# Patient Record
Sex: Female | Born: 1935 | Race: White | Hispanic: No | Marital: Married | Smoking: Never smoker
Health system: Southern US, Community
[De-identification: ages and names within clinical notes are randomized; demographics above are authoritative.]

## PROBLEM LIST (undated history)

## (undated) DIAGNOSIS — I10 Essential (primary) hypertension: Secondary | ICD-10-CM

## (undated) DIAGNOSIS — F0390 Unspecified dementia without behavioral disturbance: Secondary | ICD-10-CM

---

## 1999-02-03 ENCOUNTER — Other Ambulatory Visit: Admission: RE | Admit: 1999-02-03 | Discharge: 1999-02-03 | Payer: Self-pay | Admitting: Obstetrics and Gynecology

## 2005-02-01 ENCOUNTER — Encounter: Admission: RE | Admit: 2005-02-01 | Discharge: 2005-02-01 | Payer: Self-pay | Admitting: Orthopedic Surgery

## 2005-02-03 ENCOUNTER — Ambulatory Visit (HOSPITAL_COMMUNITY): Admission: RE | Admit: 2005-02-03 | Discharge: 2005-02-03 | Payer: Self-pay | Admitting: Orthopedic Surgery

## 2005-02-03 ENCOUNTER — Ambulatory Visit (HOSPITAL_BASED_OUTPATIENT_CLINIC_OR_DEPARTMENT_OTHER): Admission: RE | Admit: 2005-02-03 | Discharge: 2005-02-03 | Payer: Self-pay | Admitting: Orthopedic Surgery

## 2005-02-20 ENCOUNTER — Encounter: Admission: RE | Admit: 2005-02-20 | Discharge: 2005-02-20 | Payer: Self-pay | Admitting: Family Medicine

## 2007-04-13 IMAGING — CR DG CHEST 2V
2 series · 2 of 2 positions shown · non-contrast
Comparison: None.

CLINICAL DATA: Pre-op chest radiograph for olecranon fracture. 
 2-VIEW CHEST:

[w chest pa]
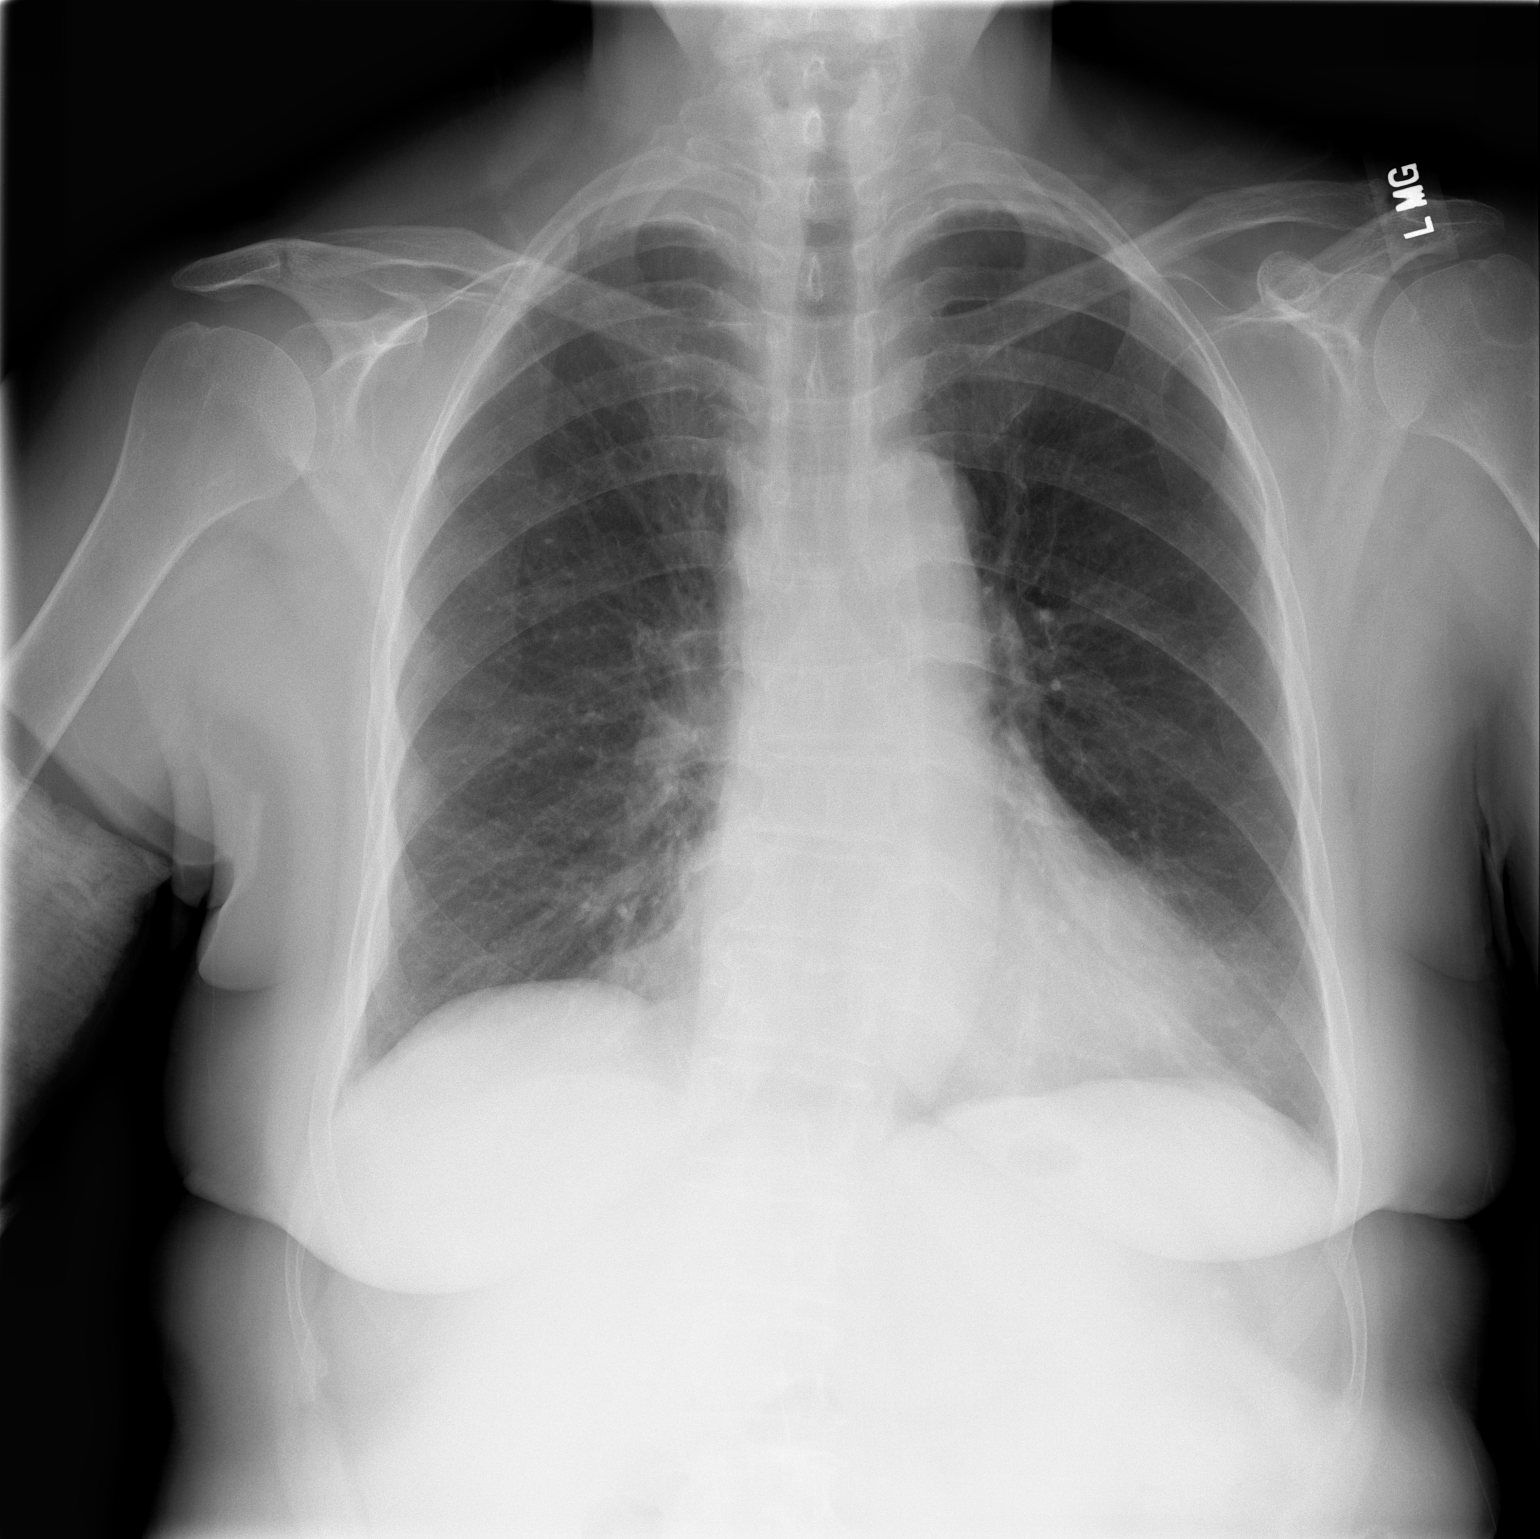

[w chest lat]
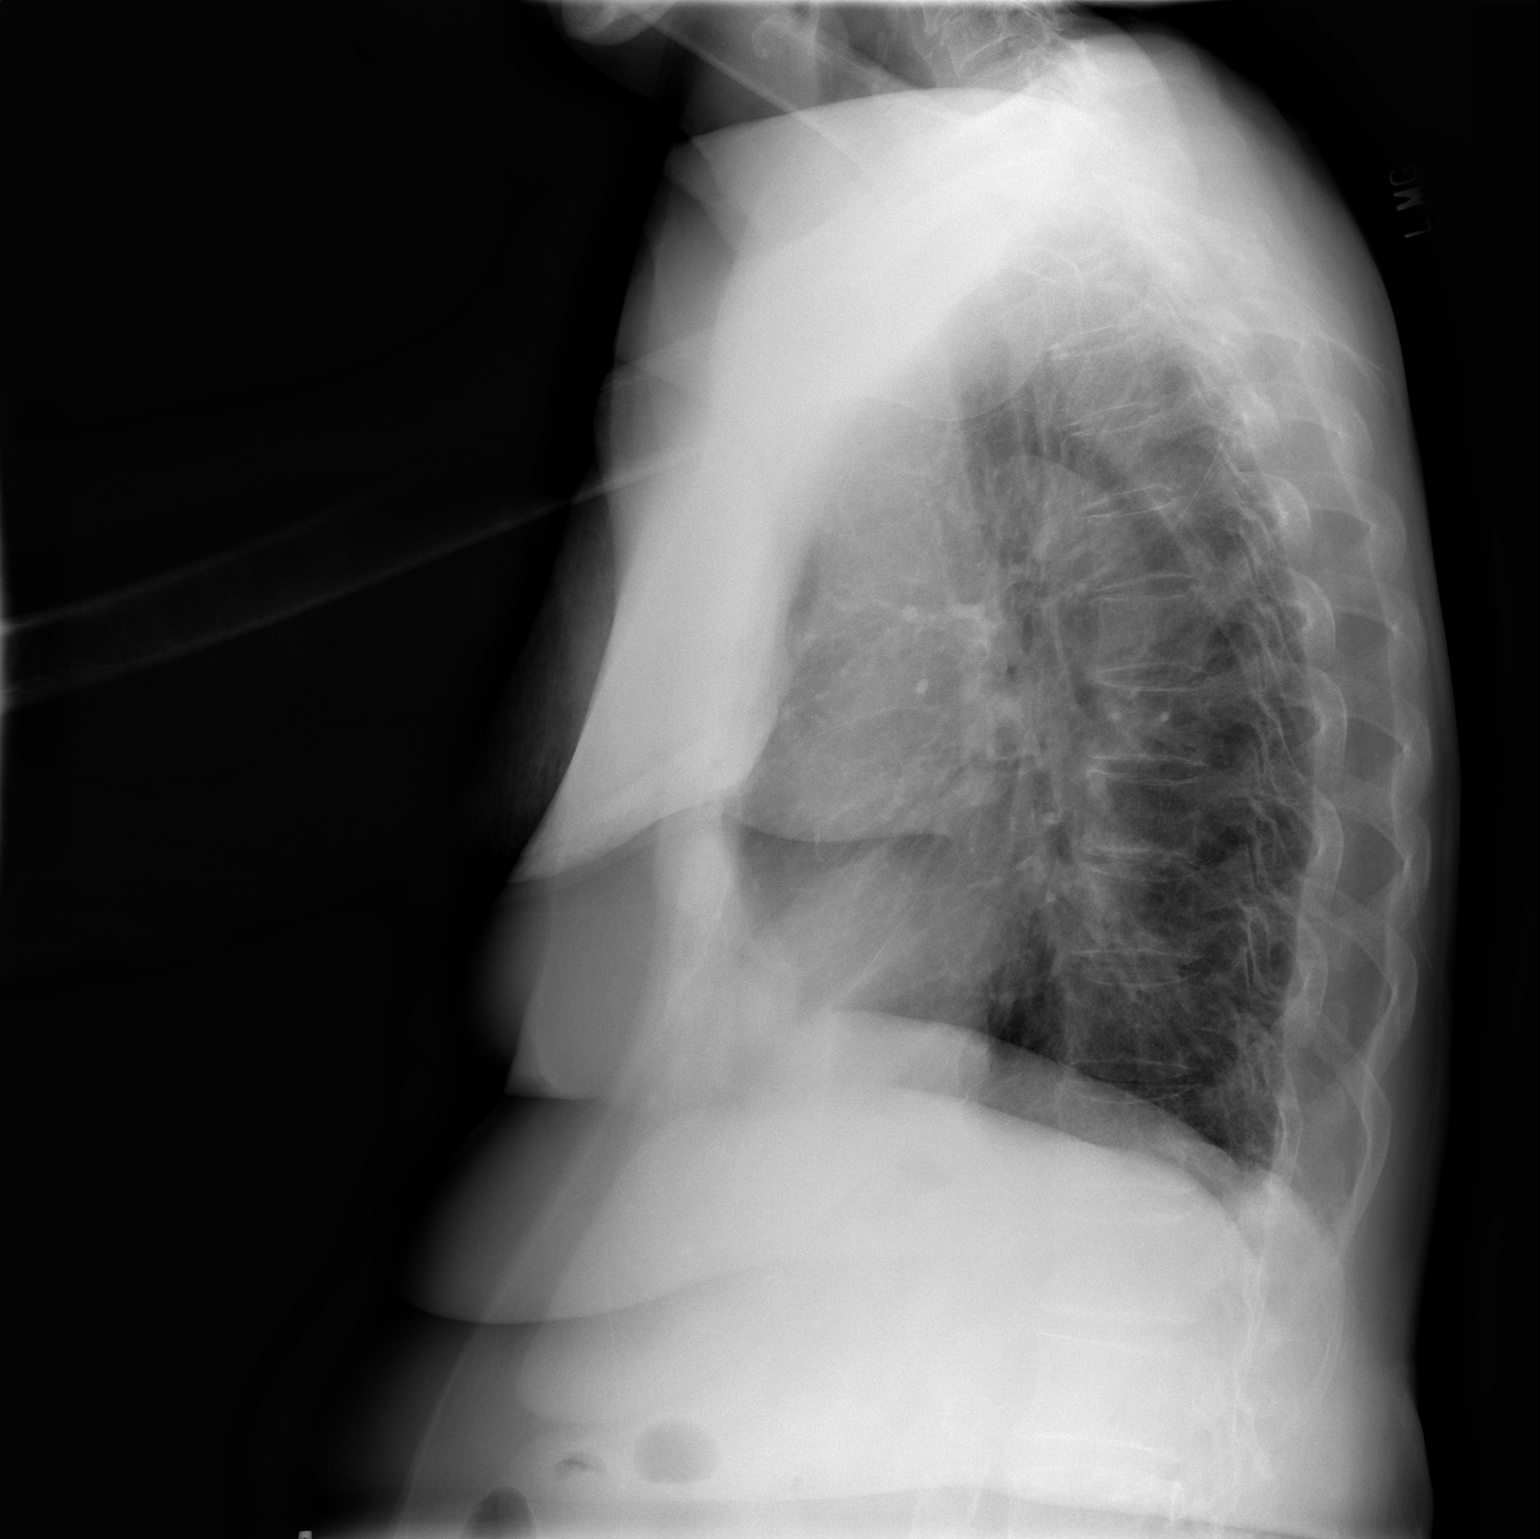

[2 of 2 positions shown; findings below may reference images not displayed]

FINDINGS: The heart size is within normal limits.   No effusions or edema.  There are no focal airspace opacities identified.  Within the right upper lobe there is a 5.9 mm nodule.  If outside films are available for comparison then I would recommend comparison with these.  Alternatively a CT scan of the chest would be recommended for a more definitive evaluation.
IMPRESSION: 6 mm nodule right upper lobe.  Recommend comparison with outside films.  If none are available then a CT of the chest is recommended.

## 2011-04-03 DIAGNOSIS — H353190 Nonexudative age-related macular degeneration, unspecified eye, stage unspecified: Secondary | ICD-10-CM | POA: Diagnosis not present

## 2011-05-12 DIAGNOSIS — F0390 Unspecified dementia without behavioral disturbance: Secondary | ICD-10-CM | POA: Diagnosis not present

## 2011-05-12 DIAGNOSIS — R7309 Other abnormal glucose: Secondary | ICD-10-CM | POA: Diagnosis not present

## 2011-05-12 DIAGNOSIS — I10 Essential (primary) hypertension: Secondary | ICD-10-CM | POA: Diagnosis not present

## 2011-05-12 DIAGNOSIS — E782 Mixed hyperlipidemia: Secondary | ICD-10-CM | POA: Diagnosis not present

## 2011-10-04 DIAGNOSIS — H353190 Nonexudative age-related macular degeneration, unspecified eye, stage unspecified: Secondary | ICD-10-CM | POA: Diagnosis not present

## 2011-11-13 DIAGNOSIS — E782 Mixed hyperlipidemia: Secondary | ICD-10-CM | POA: Diagnosis not present

## 2011-11-13 DIAGNOSIS — I10 Essential (primary) hypertension: Secondary | ICD-10-CM | POA: Diagnosis not present

## 2011-11-13 DIAGNOSIS — R7309 Other abnormal glucose: Secondary | ICD-10-CM | POA: Diagnosis not present

## 2011-11-13 DIAGNOSIS — F0390 Unspecified dementia without behavioral disturbance: Secondary | ICD-10-CM | POA: Diagnosis not present

## 2011-11-13 DIAGNOSIS — Z23 Encounter for immunization: Secondary | ICD-10-CM | POA: Diagnosis not present

## 2012-05-15 DIAGNOSIS — Z0000 Encounter for general adult medical examination without abnormal findings: Secondary | ICD-10-CM | POA: Diagnosis not present

## 2012-05-15 DIAGNOSIS — R7309 Other abnormal glucose: Secondary | ICD-10-CM | POA: Diagnosis not present

## 2012-05-15 DIAGNOSIS — E782 Mixed hyperlipidemia: Secondary | ICD-10-CM | POA: Diagnosis not present

## 2012-05-15 DIAGNOSIS — I10 Essential (primary) hypertension: Secondary | ICD-10-CM | POA: Diagnosis not present

## 2012-05-15 DIAGNOSIS — F0390 Unspecified dementia without behavioral disturbance: Secondary | ICD-10-CM | POA: Diagnosis not present

## 2012-06-10 DIAGNOSIS — H353190 Nonexudative age-related macular degeneration, unspecified eye, stage unspecified: Secondary | ICD-10-CM | POA: Diagnosis not present

## 2012-06-18 DIAGNOSIS — Z780 Asymptomatic menopausal state: Secondary | ICD-10-CM | POA: Diagnosis not present

## 2012-07-19 DIAGNOSIS — M810 Age-related osteoporosis without current pathological fracture: Secondary | ICD-10-CM | POA: Diagnosis not present

## 2012-12-04 DIAGNOSIS — E40 Kwashiorkor: Secondary | ICD-10-CM | POA: Diagnosis not present

## 2012-12-04 DIAGNOSIS — F0390 Unspecified dementia without behavioral disturbance: Secondary | ICD-10-CM | POA: Diagnosis not present

## 2012-12-04 DIAGNOSIS — Z23 Encounter for immunization: Secondary | ICD-10-CM | POA: Diagnosis not present

## 2012-12-04 DIAGNOSIS — E782 Mixed hyperlipidemia: Secondary | ICD-10-CM | POA: Diagnosis not present

## 2012-12-04 DIAGNOSIS — R7309 Other abnormal glucose: Secondary | ICD-10-CM | POA: Diagnosis not present

## 2012-12-04 DIAGNOSIS — I10 Essential (primary) hypertension: Secondary | ICD-10-CM | POA: Diagnosis not present

## 2012-12-24 DIAGNOSIS — H353190 Nonexudative age-related macular degeneration, unspecified eye, stage unspecified: Secondary | ICD-10-CM | POA: Diagnosis not present

## 2012-12-24 DIAGNOSIS — H02829 Cysts of unspecified eye, unspecified eyelid: Secondary | ICD-10-CM | POA: Diagnosis not present

## 2013-01-20 DIAGNOSIS — M810 Age-related osteoporosis without current pathological fracture: Secondary | ICD-10-CM | POA: Diagnosis not present

## 2013-08-14 DIAGNOSIS — E40 Kwashiorkor: Secondary | ICD-10-CM | POA: Diagnosis not present

## 2013-08-14 DIAGNOSIS — M810 Age-related osteoporosis without current pathological fracture: Secondary | ICD-10-CM | POA: Diagnosis not present

## 2013-08-14 DIAGNOSIS — Z0000 Encounter for general adult medical examination without abnormal findings: Secondary | ICD-10-CM | POA: Diagnosis not present

## 2013-08-14 DIAGNOSIS — R7309 Other abnormal glucose: Secondary | ICD-10-CM | POA: Diagnosis not present

## 2013-08-14 DIAGNOSIS — E782 Mixed hyperlipidemia: Secondary | ICD-10-CM | POA: Diagnosis not present

## 2013-08-14 DIAGNOSIS — Z23 Encounter for immunization: Secondary | ICD-10-CM | POA: Diagnosis not present

## 2013-08-14 DIAGNOSIS — I10 Essential (primary) hypertension: Secondary | ICD-10-CM | POA: Diagnosis not present

## 2013-08-14 DIAGNOSIS — Z1331 Encounter for screening for depression: Secondary | ICD-10-CM | POA: Diagnosis not present

## 2013-08-14 DIAGNOSIS — F0390 Unspecified dementia without behavioral disturbance: Secondary | ICD-10-CM | POA: Diagnosis not present

## 2014-01-15 DIAGNOSIS — Z23 Encounter for immunization: Secondary | ICD-10-CM | POA: Diagnosis not present

## 2014-02-20 DIAGNOSIS — E782 Mixed hyperlipidemia: Secondary | ICD-10-CM | POA: Diagnosis not present

## 2014-02-20 DIAGNOSIS — I10 Essential (primary) hypertension: Secondary | ICD-10-CM | POA: Diagnosis not present

## 2014-02-20 DIAGNOSIS — M810 Age-related osteoporosis without current pathological fracture: Secondary | ICD-10-CM | POA: Diagnosis not present

## 2014-02-20 DIAGNOSIS — R7309 Other abnormal glucose: Secondary | ICD-10-CM | POA: Diagnosis not present

## 2014-02-20 DIAGNOSIS — F0390 Unspecified dementia without behavioral disturbance: Secondary | ICD-10-CM | POA: Diagnosis not present

## 2014-02-20 DIAGNOSIS — E46 Unspecified protein-calorie malnutrition: Secondary | ICD-10-CM | POA: Diagnosis not present

## 2014-08-21 DIAGNOSIS — R7309 Other abnormal glucose: Secondary | ICD-10-CM | POA: Diagnosis not present

## 2014-08-21 DIAGNOSIS — E46 Unspecified protein-calorie malnutrition: Secondary | ICD-10-CM | POA: Diagnosis not present

## 2014-08-21 DIAGNOSIS — F0390 Unspecified dementia without behavioral disturbance: Secondary | ICD-10-CM | POA: Diagnosis not present

## 2014-08-21 DIAGNOSIS — M810 Age-related osteoporosis without current pathological fracture: Secondary | ICD-10-CM | POA: Diagnosis not present

## 2014-08-21 DIAGNOSIS — E782 Mixed hyperlipidemia: Secondary | ICD-10-CM | POA: Diagnosis not present

## 2014-08-21 DIAGNOSIS — D692 Other nonthrombocytopenic purpura: Secondary | ICD-10-CM | POA: Diagnosis not present

## 2014-08-21 DIAGNOSIS — Z0000 Encounter for general adult medical examination without abnormal findings: Secondary | ICD-10-CM | POA: Diagnosis not present

## 2014-08-21 DIAGNOSIS — I10 Essential (primary) hypertension: Secondary | ICD-10-CM | POA: Diagnosis not present

## 2014-12-05 DIAGNOSIS — Z23 Encounter for immunization: Secondary | ICD-10-CM | POA: Diagnosis not present

## 2015-02-24 DIAGNOSIS — E46 Unspecified protein-calorie malnutrition: Secondary | ICD-10-CM | POA: Diagnosis not present

## 2015-02-24 DIAGNOSIS — I10 Essential (primary) hypertension: Secondary | ICD-10-CM | POA: Diagnosis not present

## 2015-02-24 DIAGNOSIS — E782 Mixed hyperlipidemia: Secondary | ICD-10-CM | POA: Diagnosis not present

## 2015-02-24 DIAGNOSIS — F0390 Unspecified dementia without behavioral disturbance: Secondary | ICD-10-CM | POA: Diagnosis not present

## 2015-02-24 DIAGNOSIS — R7309 Other abnormal glucose: Secondary | ICD-10-CM | POA: Diagnosis not present

## 2015-11-03 DIAGNOSIS — D692 Other nonthrombocytopenic purpura: Secondary | ICD-10-CM | POA: Diagnosis not present

## 2015-11-03 DIAGNOSIS — E46 Unspecified protein-calorie malnutrition: Secondary | ICD-10-CM | POA: Diagnosis not present

## 2015-11-03 DIAGNOSIS — I10 Essential (primary) hypertension: Secondary | ICD-10-CM | POA: Diagnosis not present

## 2015-11-03 DIAGNOSIS — R7303 Prediabetes: Secondary | ICD-10-CM | POA: Diagnosis not present

## 2015-11-03 DIAGNOSIS — Z0000 Encounter for general adult medical examination without abnormal findings: Secondary | ICD-10-CM | POA: Diagnosis not present

## 2015-11-03 DIAGNOSIS — M810 Age-related osteoporosis without current pathological fracture: Secondary | ICD-10-CM | POA: Diagnosis not present

## 2015-11-03 DIAGNOSIS — E782 Mixed hyperlipidemia: Secondary | ICD-10-CM | POA: Diagnosis not present

## 2015-11-03 DIAGNOSIS — Z23 Encounter for immunization: Secondary | ICD-10-CM | POA: Diagnosis not present

## 2015-11-03 DIAGNOSIS — F0390 Unspecified dementia without behavioral disturbance: Secondary | ICD-10-CM | POA: Diagnosis not present

## 2015-12-01 DIAGNOSIS — M810 Age-related osteoporosis without current pathological fracture: Secondary | ICD-10-CM | POA: Diagnosis not present

## 2015-12-14 DIAGNOSIS — M810 Age-related osteoporosis without current pathological fracture: Secondary | ICD-10-CM | POA: Diagnosis not present

## 2016-05-31 DIAGNOSIS — E782 Mixed hyperlipidemia: Secondary | ICD-10-CM | POA: Diagnosis not present

## 2016-05-31 DIAGNOSIS — F0390 Unspecified dementia without behavioral disturbance: Secondary | ICD-10-CM | POA: Diagnosis not present

## 2016-05-31 DIAGNOSIS — D692 Other nonthrombocytopenic purpura: Secondary | ICD-10-CM | POA: Diagnosis not present

## 2016-05-31 DIAGNOSIS — E46 Unspecified protein-calorie malnutrition: Secondary | ICD-10-CM | POA: Diagnosis not present

## 2016-05-31 DIAGNOSIS — R7303 Prediabetes: Secondary | ICD-10-CM | POA: Diagnosis not present

## 2016-05-31 DIAGNOSIS — I10 Essential (primary) hypertension: Secondary | ICD-10-CM | POA: Diagnosis not present

## 2016-06-13 DIAGNOSIS — M810 Age-related osteoporosis without current pathological fracture: Secondary | ICD-10-CM | POA: Diagnosis not present

## 2016-11-22 DIAGNOSIS — E46 Unspecified protein-calorie malnutrition: Secondary | ICD-10-CM | POA: Diagnosis not present

## 2016-11-22 DIAGNOSIS — I10 Essential (primary) hypertension: Secondary | ICD-10-CM | POA: Diagnosis not present

## 2016-11-22 DIAGNOSIS — M810 Age-related osteoporosis without current pathological fracture: Secondary | ICD-10-CM | POA: Diagnosis not present

## 2016-11-22 DIAGNOSIS — D692 Other nonthrombocytopenic purpura: Secondary | ICD-10-CM | POA: Diagnosis not present

## 2016-11-22 DIAGNOSIS — R7303 Prediabetes: Secondary | ICD-10-CM | POA: Diagnosis not present

## 2016-11-22 DIAGNOSIS — F0390 Unspecified dementia without behavioral disturbance: Secondary | ICD-10-CM | POA: Diagnosis not present

## 2016-11-22 DIAGNOSIS — Z0000 Encounter for general adult medical examination without abnormal findings: Secondary | ICD-10-CM | POA: Diagnosis not present

## 2016-11-22 DIAGNOSIS — Z23 Encounter for immunization: Secondary | ICD-10-CM | POA: Diagnosis not present

## 2016-11-22 DIAGNOSIS — E782 Mixed hyperlipidemia: Secondary | ICD-10-CM | POA: Diagnosis not present

## 2016-12-15 DIAGNOSIS — M810 Age-related osteoporosis without current pathological fracture: Secondary | ICD-10-CM | POA: Diagnosis not present

## 2017-06-19 DIAGNOSIS — M810 Age-related osteoporosis without current pathological fracture: Secondary | ICD-10-CM | POA: Diagnosis not present

## 2017-06-19 DIAGNOSIS — M25552 Pain in left hip: Secondary | ICD-10-CM | POA: Diagnosis not present

## 2017-06-25 ENCOUNTER — Encounter (INDEPENDENT_AMBULATORY_CARE_PROVIDER_SITE_OTHER): Payer: Self-pay | Admitting: Orthopaedic Surgery

## 2017-06-25 ENCOUNTER — Ambulatory Visit (INDEPENDENT_AMBULATORY_CARE_PROVIDER_SITE_OTHER): Payer: Medicare Other | Admitting: Orthopaedic Surgery

## 2017-06-25 ENCOUNTER — Ambulatory Visit (INDEPENDENT_AMBULATORY_CARE_PROVIDER_SITE_OTHER): Payer: Medicare Other

## 2017-06-25 DIAGNOSIS — M25552 Pain in left hip: Secondary | ICD-10-CM

## 2017-06-25 NOTE — Progress Notes (Signed)
   Office Visit Note   Patient: Patricia Carlson           Date of Birth: 06/08/1925           MRN: [id number] Visit Date: 06/25/2017              Requested by: Maurice Small, MD [address] [place], [place] [address] PCP: Arabella Merles, CNM   Assessment & Plan: Visit Diagnoses:  1. Pain in left hip     Plan: I do feel that this is a component of trochanteric bursitis.  However pain is minimal and I did offer a steroid injection but she declined this saying that she is not having really much pain at all.  I talked her about trying a cane in her opposite hand if she is any fall risk at all.  All question concerns were answered and addressed.  Follow-up will be as needed.  I did show her stretching exercises to try.  Follow-Up Instructions: Return if symptoms worsen or fail to improve.   Orders:  Orders Placed This Encounter  Procedures  . XR HIP UNILAT W OR W/O PELVIS 1V LEFT   No orders of the defined types were placed in this encounter.     Procedures: No procedures performed   Clinical Data: No additional findings.   Subjective: Chief Complaint  Patient presents with  . Left Hip - Pain  The patient is a very pleasant 84 year old female who comes in originally for left hip pain it was causing her to have difficulty walking.  It was a lateral aspect of her hip was a source of her pain.  She denies any injuries.  She is not walking there with any type of cane and is pain-free per her report today and has no issues.  She denies any recent falls.  She does have a cane at home but only uses it if she is having issues.  Her daughter is with her today and says that a couple weeks ago she was hurting significantly.  HPI  Review of Systems She currently denies any headache, chest pain, shortness of breath, fever, chills, nausea, vomiting. Objective: Vital Signs: There were no vitals taken for this visit.  Physical Exam She is alert and oriented x3 in  no acute distress Ortho Exam Examination of her left hip shows fluid internal and external rotation with no pain at all.  She only has minimal pain to palpation of the trochanteric area only. Specialty Comments:  No specialty comments available.  Imaging: Xr Hip Unilat W Or W/o Pelvis 1v Left  Result Date: 06/25/2017 An AP pelvis and lateral left hip shows no acute findings.  There is no evidence of fracture.  The hip joint itself is well-maintained in terms of the joint space.    PMFS History: There are no active problems to display for this patient.  History reviewed. No pertinent past medical history.  History reviewed. No pertinent family history.  History reviewed. No pertinent surgical history. Social History   Occupational History  . Not on file  Tobacco Use  . Smoking status: Not on file  Substance and Sexual Activity  . Alcohol use: Not on file  . Drug use: Not on file  . Sexual activity: Not on file

## 2017-08-22 DIAGNOSIS — H6123 Impacted cerumen, bilateral: Secondary | ICD-10-CM | POA: Diagnosis not present

## 2017-12-26 DIAGNOSIS — Z0000 Encounter for general adult medical examination without abnormal findings: Secondary | ICD-10-CM | POA: Diagnosis not present

## 2017-12-26 DIAGNOSIS — D692 Other nonthrombocytopenic purpura: Secondary | ICD-10-CM | POA: Diagnosis not present

## 2017-12-26 DIAGNOSIS — R159 Full incontinence of feces: Secondary | ICD-10-CM | POA: Diagnosis not present

## 2017-12-26 DIAGNOSIS — E782 Mixed hyperlipidemia: Secondary | ICD-10-CM | POA: Diagnosis not present

## 2017-12-26 DIAGNOSIS — F0390 Unspecified dementia without behavioral disturbance: Secondary | ICD-10-CM | POA: Diagnosis not present

## 2017-12-26 DIAGNOSIS — Z23 Encounter for immunization: Secondary | ICD-10-CM | POA: Diagnosis not present

## 2017-12-26 DIAGNOSIS — M810 Age-related osteoporosis without current pathological fracture: Secondary | ICD-10-CM | POA: Diagnosis not present

## 2017-12-26 DIAGNOSIS — E46 Unspecified protein-calorie malnutrition: Secondary | ICD-10-CM | POA: Diagnosis not present

## 2017-12-26 DIAGNOSIS — I10 Essential (primary) hypertension: Secondary | ICD-10-CM | POA: Diagnosis not present

## 2017-12-26 DIAGNOSIS — R7303 Prediabetes: Secondary | ICD-10-CM | POA: Diagnosis not present

## 2018-06-27 DIAGNOSIS — F0390 Unspecified dementia without behavioral disturbance: Secondary | ICD-10-CM | POA: Diagnosis not present

## 2018-06-27 DIAGNOSIS — M810 Age-related osteoporosis without current pathological fracture: Secondary | ICD-10-CM | POA: Diagnosis not present

## 2018-06-27 DIAGNOSIS — E782 Mixed hyperlipidemia: Secondary | ICD-10-CM | POA: Diagnosis not present

## 2018-06-27 DIAGNOSIS — R7303 Prediabetes: Secondary | ICD-10-CM | POA: Diagnosis not present

## 2018-06-27 DIAGNOSIS — I10 Essential (primary) hypertension: Secondary | ICD-10-CM | POA: Diagnosis not present

## 2018-08-28 DIAGNOSIS — M810 Age-related osteoporosis without current pathological fracture: Secondary | ICD-10-CM | POA: Diagnosis not present

## 2018-10-08 DIAGNOSIS — H6123 Impacted cerumen, bilateral: Secondary | ICD-10-CM | POA: Diagnosis not present

## 2019-01-01 DIAGNOSIS — Z0000 Encounter for general adult medical examination without abnormal findings: Secondary | ICD-10-CM | POA: Diagnosis not present

## 2019-01-01 DIAGNOSIS — R7303 Prediabetes: Secondary | ICD-10-CM | POA: Diagnosis not present

## 2019-01-01 DIAGNOSIS — M810 Age-related osteoporosis without current pathological fracture: Secondary | ICD-10-CM | POA: Diagnosis not present

## 2019-01-01 DIAGNOSIS — F0390 Unspecified dementia without behavioral disturbance: Secondary | ICD-10-CM | POA: Diagnosis not present

## 2019-01-01 DIAGNOSIS — D692 Other nonthrombocytopenic purpura: Secondary | ICD-10-CM | POA: Diagnosis not present

## 2019-01-01 DIAGNOSIS — Z23 Encounter for immunization: Secondary | ICD-10-CM | POA: Diagnosis not present

## 2019-01-01 DIAGNOSIS — I10 Essential (primary) hypertension: Secondary | ICD-10-CM | POA: Diagnosis not present

## 2019-01-01 DIAGNOSIS — E46 Unspecified protein-calorie malnutrition: Secondary | ICD-10-CM | POA: Diagnosis not present

## 2019-01-01 DIAGNOSIS — E782 Mixed hyperlipidemia: Secondary | ICD-10-CM | POA: Diagnosis not present

## 2019-01-01 DIAGNOSIS — R159 Full incontinence of feces: Secondary | ICD-10-CM | POA: Diagnosis not present

## 2019-02-15 ENCOUNTER — Encounter (HOSPITAL_COMMUNITY): Payer: Self-pay | Admitting: Emergency Medicine

## 2019-02-15 ENCOUNTER — Inpatient Hospital Stay (HOSPITAL_COMMUNITY)
Admission: EM | Admit: 2019-02-15 | Discharge: 2019-02-22 | DRG: 640 | Disposition: A | Discharge status: Skilled Nursing Facility | Payer: Medicare Other | Attending: Internal Medicine | Admitting: Internal Medicine

## 2019-02-15 ENCOUNTER — Other Ambulatory Visit: Payer: Self-pay

## 2019-02-15 ENCOUNTER — Observation Stay (HOSPITAL_COMMUNITY): Payer: Medicare Other

## 2019-02-15 ENCOUNTER — Emergency Department (HOSPITAL_COMMUNITY): Payer: Medicare Other

## 2019-02-15 DIAGNOSIS — R197 Diarrhea, unspecified: Secondary | ICD-10-CM

## 2019-02-15 DIAGNOSIS — Z66 Do not resuscitate: Secondary | ICD-10-CM | POA: Diagnosis not present

## 2019-02-15 DIAGNOSIS — E43 Unspecified severe protein-calorie malnutrition: Secondary | ICD-10-CM | POA: Diagnosis not present

## 2019-02-15 DIAGNOSIS — Z20822 Contact with and (suspected) exposure to covid-19: Secondary | ICD-10-CM | POA: Diagnosis not present

## 2019-02-15 DIAGNOSIS — Z79899 Other long term (current) drug therapy: Secondary | ICD-10-CM

## 2019-02-15 DIAGNOSIS — R140 Abdominal distension (gaseous): Secondary | ICD-10-CM | POA: Diagnosis present

## 2019-02-15 DIAGNOSIS — E785 Hyperlipidemia, unspecified: Secondary | ICD-10-CM | POA: Diagnosis present

## 2019-02-15 DIAGNOSIS — R52 Pain, unspecified: Secondary | ICD-10-CM | POA: Diagnosis not present

## 2019-02-15 DIAGNOSIS — R4182 Altered mental status, unspecified: Secondary | ICD-10-CM | POA: Diagnosis not present

## 2019-02-15 DIAGNOSIS — I10 Essential (primary) hypertension: Secondary | ICD-10-CM | POA: Diagnosis present

## 2019-02-15 DIAGNOSIS — F0390 Unspecified dementia without behavioral disturbance: Secondary | ICD-10-CM | POA: Diagnosis not present

## 2019-02-15 DIAGNOSIS — E876 Hypokalemia: Secondary | ICD-10-CM | POA: Diagnosis not present

## 2019-02-15 DIAGNOSIS — Z03818 Encounter for observation for suspected exposure to other biological agents ruled out: Secondary | ICD-10-CM | POA: Diagnosis not present

## 2019-02-15 DIAGNOSIS — E860 Dehydration: Secondary | ICD-10-CM

## 2019-02-15 DIAGNOSIS — H9190 Unspecified hearing loss, unspecified ear: Secondary | ICD-10-CM | POA: Diagnosis present

## 2019-02-15 DIAGNOSIS — Z681 Body mass index (BMI) 19 or less, adult: Secondary | ICD-10-CM

## 2019-02-15 DIAGNOSIS — Z803 Family history of malignant neoplasm of breast: Secondary | ICD-10-CM

## 2019-02-15 DIAGNOSIS — R5381 Other malaise: Secondary | ICD-10-CM | POA: Diagnosis not present

## 2019-02-15 DIAGNOSIS — Z515 Encounter for palliative care: Secondary | ICD-10-CM

## 2019-02-15 DIAGNOSIS — R64 Cachexia: Secondary | ICD-10-CM | POA: Diagnosis not present

## 2019-02-15 DIAGNOSIS — R0902 Hypoxemia: Secondary | ICD-10-CM | POA: Diagnosis not present

## 2019-02-15 DIAGNOSIS — R531 Weakness: Secondary | ICD-10-CM | POA: Diagnosis not present

## 2019-02-15 DIAGNOSIS — R627 Adult failure to thrive: Secondary | ICD-10-CM | POA: Diagnosis present

## 2019-02-15 HISTORY — DX: Essential (primary) hypertension: I10

## 2019-02-15 HISTORY — DX: Unspecified dementia, unspecified severity, without behavioral disturbance, psychotic disturbance, mood disturbance, and anxiety: F03.90

## 2019-02-15 LAB — COMPREHENSIVE METABOLIC PANEL
ALT: 10 U/L (ref 0–44)
AST: 19 U/L (ref 15–41)
Albumin: 2.9 g/dL — ABNORMAL LOW (ref 3.5–5.0)
Alkaline Phosphatase: 56 U/L (ref 38–126)
Anion gap: 13 (ref 5–15)
BUN: 19 mg/dL (ref 8–23)
CO2: 31 mmol/L (ref 22–32)
Calcium: 7.6 mg/dL — ABNORMAL LOW (ref 8.9–10.3)
Chloride: 98 mmol/L (ref 98–111)
Creatinine, Ser: 0.69 mg/dL (ref 0.44–1.00)
GFR calc Af Amer: >60 mL/min (ref >60)
GFR calc non Af Amer: >60 mL/min (ref >60)
Glucose, Bld: 105 mg/dL — ABNORMAL HIGH (ref 70–99)
Potassium: 2.8 mmol/L — ABNORMAL LOW (ref 3.5–5.1)
Sodium: 142 mmol/L (ref 135–145)
Total Bilirubin: 1.5 mg/dL — ABNORMAL HIGH (ref 0.3–1.2)
Total Protein: 5.5 g/dL — ABNORMAL LOW (ref 6.5–8.1)

## 2019-02-15 LAB — RESPIRATORY PANEL BY RT PCR (FLU A&B, COVID)
Influenza A by PCR: NEGATIVE
Influenza B by PCR: NEGATIVE
SARS Coronavirus 2 by RT PCR: NEGATIVE

## 2019-02-15 LAB — MAGNESIUM: Magnesium: 2.3 mg/dL (ref 1.7–2.4)

## 2019-02-15 LAB — CBC WITH DIFFERENTIAL/PLATELET
Abs Immature Granulocytes: 0.03 10*3/uL (ref 0.00–0.07)
Basophils Absolute: 0 10*3/uL (ref 0.0–0.1)
Basophils Relative: 1 %
Eosinophils Absolute: 0 10*3/uL (ref 0.0–0.5)
Eosinophils Relative: 0 %
HCT: 44.5 % (ref 36.0–46.0)
Hemoglobin: 14.2 g/dL (ref 12.0–15.0)
Immature Granulocytes: 0 %
Lymphocytes Relative: 13 %
Lymphs Abs: 0.9 10*3/uL (ref 0.7–4.0)
MCH: 32.8 pg (ref 26.0–34.0)
MCHC: 31.9 g/dL (ref 30.0–36.0)
MCV: 102.8 fL — ABNORMAL HIGH (ref 80.0–100.0)
Monocytes Absolute: 0.4 10*3/uL (ref 0.1–1.0)
Monocytes Relative: 6 %
Neutro Abs: 5.9 10*3/uL (ref 1.7–7.7)
Neutrophils Relative %: 80 %
Platelets: 155 10*3/uL (ref 150–400)
RBC: 4.33 MIL/uL (ref 3.87–5.11)
RDW: 14.9 % (ref 11.5–15.5)
WBC: 7.3 10*3/uL (ref 4.0–10.5)
nRBC: 0 % (ref 0.0–0.2)

## 2019-02-15 LAB — CK: Total CK: 40 U/L (ref 38–234)

## 2019-02-15 LAB — TSH: TSH: 3.142 u[IU]/mL (ref 0.350–4.500)

## 2019-02-15 LAB — LACTIC ACID, PLASMA: Lactic Acid, Venous: 1.8 mmol/L (ref 0.5–1.9)

## 2019-02-15 LAB — PHOSPHORUS: Phosphorus: 2.5 mg/dL (ref 2.5–4.6)

## 2019-02-15 MED ORDER — SODIUM CHLORIDE 0.9 % IV SOLN: INTRAVENOUS | Status: DC

## 2019-02-15 MED ORDER — LACTATED RINGERS IV BOLUS
1000.0000 mL | Freq: Once | INTRAVENOUS | Status: AC
  Administered 2019-02-15: 1000 mL via INTRAVENOUS

## 2019-02-15 MED ORDER — POTASSIUM CHLORIDE 10 MEQ/100ML IV SOLN
10.0000 meq | INTRAVENOUS | Status: DC
  Administered 2019-02-15 – 2019-02-16 (×3): 10 meq via INTRAVENOUS
  Filled 2019-02-15 (×2): qty 100

## 2019-02-15 MED ORDER — POTASSIUM CHLORIDE 10 MEQ/100ML IV SOLN
10.0000 meq | Freq: Once | INTRAVENOUS | Status: AC
  Administered 2019-02-15: 23:00:00 10 meq via INTRAVENOUS
  Filled 2019-02-15: qty 100

## 2019-02-15 NOTE — ED Triage Notes (Signed)
Per [facility] pt from home where she lives by herself. Has dementia until 2 weeks ago daughter states that patient was caring for herself and fixing her own food but since hasn't been able. EMS reports not able to move around the past couple days and leaning her her side.   Vitals: palpated 136, 72HR, 18R, 92% CBG 192, 97.5.

## 2019-02-15 NOTE — H&P (Signed)
Patricia NeasJacqueline D Carlson ZOX:[id number] DOB: 10/25/1925 DOA: 02/15/2019     PCP: Arabella MerlesShaw, Kimberly D, CNM   Outpatient Specialists:   NONE    Patient arrived to ER on 02/15/19 at 1135  Patient coming from: home Lives alone,       Chief Complaint:  Unable to care for self   HPI: Patricia Carlson is a 86 y.o. female with medical history significant of dementia, HTN, HLD    Presented with unable to move as her usual and now leaning to her side for the past 2 weeks patient has been getting progressively worse.  Prior to the shoulder episode on and was able to cook for herself.  EMS was called on arrival heart rate 72 satting 92% on room air CBG 192 afebrile temperature 97.5 Overall been feeling generally fatigued.  Not eating or drinking much.  Patient denied any unilateral weakness no headache no chest pain no abdominal pain  or vomiting. Had mild diarrhea  This morning she could not even get out of bed  Family thinks she has not had her meds for the past 1 week   Infectious risk factors:  Reports  severe fatigue    In  ER  PCR  COVID TEST  NEGATIVE   Lab Results  Component Value Date   SARSCOV2NAA NEGATIVE 02/15/2019     Regarding pertinent Chronic problems:     Hyperlipidemia -  on statins zocor    HTN on Norvasc, lisinopril  Dementia - on Aricept and Nemenda    While in ER: Noted to have potassium down to 2.9  The following Work up has been ordered so far:  Orders Placed This Encounter  Procedures  . Urine culture  . Respiratory Panel by RT PCR (Flu A&B, Covid) - Nasopharyngeal Swab  . DG Chest Port 1 View  . CT Head Wo Contrast  . CBC with Differential  . Lactate  . Urinalysis, Routine w reflex microscopic  . Comprehensive metabolic panel  . TSH  . T4  . Consult to hospitalist  ALL PATIENTS BEING ADMITTED/HAVING PROCEDURES NEED COVID-19 SCREENING  . EKG  . EKG 12-Lead  . EKG 12-Lead     Following Medications were ordered in ER: Medications   potassium chloride 10 mEq in 100 mL IVPB (has no administration in time range)  0.9 %  sodium chloride infusion (has no administration in time range)  lactated ringers bolus 1,000 mL (0 mLs Intravenous Stopped 02/15/19 2013)        Consult Orders  (From admission, onward)         Start     Ordered   02/15/19 2119  Consult to hospitalist  ALL PATIENTS BEING ADMITTED/HAVING PROCEDURES NEED COVID-19 SCREENING  Once    Comments: ALL PATIENTS BEING ADMITTED/HAVING PROCEDURES NEED COVID-19 SCREENING  Provider:  (Not yet assigned)  Question Answer Comment  Place call to: [facility] [phone]   Reason for Consult Admit      02/15/19 2118          Significant initial  Findings: Abnormal Labs Reviewed  CBC WITH DIFFERENTIAL/PLATELET - Abnormal; Notable for the following components:      Result Value   MCV 102.8 (*)    All other components within normal limits  COMPREHENSIVE METABOLIC PANEL - Abnormal; Notable for the following components:   Potassium 2.8 (*)    Glucose, Bld 105 (*)    Calcium 7.6 (*)    Total Protein 5.5 (*)  Albumin 2.9 (*)    Total Bilirubin 1.5 (*)    All other components within normal limits     Otherwise labs showing:    Recent Labs  Lab 02/15/19 1801  NA 142  K 2.8*  CO2 31  GLUCOSE 105*  BUN 19  CREATININE 0.69  CALCIUM 7.6*    Cr    stable,    Lab Results  Component Value Date   CREATININE 0.69 02/15/2019    Recent Labs  Lab 02/15/19 1801  AST 19  ALT 10  ALKPHOS 56  BILITOT 1.5*  PROT 5.5*  ALBUMIN 2.9*   Lab Results  Component Value Date   CALCIUM 7.6 (L) 02/15/2019   WBC       Component Value Date/Time   WBC 7.3 02/15/2019 1547   ANC    Component Value Date/Time   NEUTROABS 5.9 02/15/2019 1547    Plt: Lab Results  Component Value Date   PLT 155 02/15/2019     Lactic Acid, Venous    Component Value Date/Time   LATICACIDVEN 1.8 02/15/2019 1547       COVID-19 Labs  No results for input(s): DDIMER,  FERRITIN, LDH, CRP in the last 72 hours.  Lab Results  Component Value Date   SARSCOV2NAA NEGATIVE 02/15/2019     HG/HCT   Stable,     Component Value Date/Time   HGB 14.2 02/15/2019 1547   HCT 44.5 02/15/2019 1547     Cardiac Panel (last 3 results) No results for input(s): CKTOTAL, CKMB, TROPONINI, RELINDX in the last 72 hours.     ECG: Ordered Personally reviewed by me showing: HR : 74 Rhythm: NSR    no evidence of ischemic changes QTC 579    UA ordered        Ordered  CT HEAD   NON acute  CXR -  NON acute    ED Triage Vitals  Enc Vitals Group     BP 02/15/19 1145 128/65     Pulse Rate 02/15/19 1145 78     Resp 02/15/19 1145 17     Temp 02/15/19 1145 (!) 96.5 F (35.8 C)     Temp Source 02/15/19 1145 Oral     SpO2 02/15/19 1145 95 %     Weight --      Height --      Head Circumference --      Peak Flow --      Pain Score 02/15/19 1157 0     Pain Loc --      Pain Edu? --      Excl. in GC? --   TMAX(24)@       Latest  Blood pressure (!) 152/47, pulse 81, temperature (!) 96.5 F (35.8 C), temperature source Oral, resp. rate 13, SpO2 96 %.     Hospitalist was called for admission for hypokalemia   Review of Systems:    Pertinent positives include:  fatigue,  Constitutional:  No weight loss, night sweats, Fevers, chills, weight loss  HEENT:  No headaches, Difficulty swallowing,Tooth/dental problems,Sore throat,  No sneezing, itching, ear ache, nasal congestion, post nasal drip,  Cardio-vascular:  No chest pain, Orthopnea, PND, anasarca, dizziness, palpitations.no Bilateral lower extremity swelling  GI:  No heartburn, indigestion, abdominal pain, nausea, vomiting, diarrhea, change in bowel habits, loss of appetite, melena, blood in stool, hematemesis Resp:  no shortness of breath at rest. No dyspnea on exertion, No excess mucus, no productive cough, No non-productive cough, No coughing up  of blood.No change in color of mucus.No wheezing. Skin:   no rash or lesions. No jaundice GU:  no dysuria, change in color of urine, no urgency or frequency. No straining to urinate.  No flank pain.  Musculoskeletal:  No joint pain or no joint swelling. No decreased range of motion. No back pain.  Psych:  No change in mood or affect. No depression or anxiety. No memory loss.  Neuro: no localizing neurological complaints, no tingling, no weakness, no double vision, no gait abnormality, no slurred speech, no confusion  All systems reviewed and apart from HOPI all are negative  Past Medical History:  History reviewed. No pertinent past medical history.    History reviewed. No pertinent surgical history.  Social History:  Ambulatory  cane,       reports that she has never smoked. She has never used smokeless tobacco. She reports that she does not drink alcohol. No history on file for drug.   Family History:   Family History  Problem Relation Age of Onset  . Breast cancer Mother   . Diabetes Neg Hx     Allergies: Not on File   Prior to Admission medications   Medication Sig Start Date End Date Taking? Authorizing Provider  amLODipine (NORVASC) 10 MG tablet Take 10 mg by mouth daily. 02/04/19  Yes [provider]  donepezil (ARICEPT) 10 MG tablet Take 10 mg by mouth daily. 02/04/19  Yes [provider]  lisinopril (ZESTRIL) 10 MG tablet Take 10 mg by mouth daily. 02/04/19  Yes [provider]  memantine (NAMENDA) 10 MG tablet Take 10 mg by mouth 2 (two) times daily. 02/04/19  Yes [provider]  simvastatin (ZOCOR) 40 MG tablet Take 40 mg by mouth daily. 02/04/19  Yes [provider]   Physical Exam: Blood pressure (!) 152/47, pulse 81, temperature (!) 96.5 F (35.8 C), temperature source Oral, resp. rate 13, SpO2 96 %. 1. General:  in No  Acute distress   Chronically ill -appearing 2. Psychological: Alert and   Oriented 3. Head/ENT:    Dry Mucous Membranes                           Head Non traumatic, neck supple                          Poor Dentition 4. SKIN:   decreased Skin turgor,  Skin clean Dry and intact no rash 5. Heart: Regular rate and rhythm no  Murmur, no Rub or gallop 6. Lungs:   no wheezes or crackles   7. Abdomen: Soft,  non-tender, distended bowel sounds present 8. Lower extremities: no clubbing, cyanosis, no  edema 9. Neurologically Grossly intact, moving all 4 extremities equally  10. MSK: Normal range of motion   All other LABS:     Recent Labs  Lab 02/15/19 1547  WBC 7.3  NEUTROABS 5.9  HGB 14.2  HCT 44.5  MCV 102.8*  PLT 155     Recent Labs  Lab 02/15/19 1801  NA 142  K 2.8*  CL 98  CO2 31  GLUCOSE 105*  BUN 19  CREATININE 0.69  CALCIUM 7.6*     Recent Labs  Lab 02/15/19 1801  AST 19  ALT 10  ALKPHOS 56  BILITOT 1.5*  PROT 5.5*  ALBUMIN 2.9*       Cultures: No results found for: SDES, SPECREQUEST, CULT, REPTSTATUS  Radiological Exams on Admission: CT Head Wo Contrast  Result Date: 02/15/2019 CLINICAL DATA:  Altered mental status EXAM: CT HEAD WITHOUT CONTRAST TECHNIQUE: Contiguous axial images were obtained from the base of the skull through the vertex without intravenous contrast. COMPARISON:  None. FINDINGS: Brain: No evidence of acute infarction, hemorrhage, hydrocephalus, extra-axial collection or mass lesion/mass effect. There is mild cerebral volume loss with associated ex vacuo dilatation. Periventricular white matter hypoattenuation likely represents chronic small vessel ischemic disease. Vascular: There are vascular calcifications in the carotid siphons. Skull: Normal. Negative for fracture or focal lesion. Sinuses/Orbits: No acute finding. Other: None. IMPRESSION: 1. No acute intracranial process. Electronically Signed   By: Romona Curls M.D.   On: 02/15/2019 16:21   DG Chest Port 1 View  Result Date: 02/15/2019 CLINICAL DATA:  Weakness EXAM: PORTABLE CHEST 1 VIEW COMPARISON:  CT chest dated 02/20/2005  and chest radiograph dated 02/01/2005. FINDINGS: The heart size and mediastinal contours are within normal limits. Both lungs are clear. The visualized skeletal structures are unremarkable. IMPRESSION: No active disease. Electronically Signed   By: Romona Curls M.D.   On: 02/15/2019 16:17    Chart has been reviewed   Assessment/Plan  86 y.o. female with medical history significant of dementia, HTN, HLD     Admitted for debility  Present on Admission: . Dementia without behavioral disturbance (HCC) expect some degree of sundowning while hospitalized restart Namenda  . Dehydration rehydrate we will have nutrition assess for any nutritional needs.  . Hypokalemia replace check magnesium level Check phosphate level  . Hypocalcemia check ionized calcium level given low albumin.  Replace as needed  Generalized debility no localized symptoms will have PT OT assess prior to discharge may need placement discussed with family at this point they would like to avoid over aggressive interventions would like to hold off on pursuing MRI of the brain or any aggressive cardiac work-up.  At this point they would like to go ahead with IV fluid resuscitation electrolyte correction and antibiotics if needed but otherwise agree will avoid escalation of care.  Abdominal distention Given decreased p.o. intake in the elderly will obtain KUB is obstipation can sometimes present in this way  Other plan as per orders.  DVT prophylaxis:  SCD     Code Status:   DNR/DNI  as per  family  I had personally discussed CODE STATUS with   Family  I had spent 15 min discussing goals of care and CODE STATUS  Family Communication:   Family not at  Bedside  plan of care was discussed on the phone with   Daughter   Disposition Plan:    likely will need placement for rehabilitation                                              Would benefit from PT/OT eval prior to DC  Ordered                  Social Work  consulted                    Nutrition    consulted                  pallitiave                    Consults called:  none  Admission status:  ED Disposition    None      Obs      Level of care     tele  For   24H       Precautions: admitted as Covid Negative  No active isolations    PPE: Used by the provider:   P100  eye Goggles,  Gloves   Goldye Tourangeau 02/15/2019, 10:27 PM    [facility]     after 2 AM please page floor coverage PA If 7AM-7PM, please contact the day team taking care of the patient using [email or web address]

## 2019-02-15 NOTE — ED Provider Notes (Signed)
[facility]-EMERGENCY DEPT Provider Note   CSN: [id number] Arrival date & time: 02/15/19  1135     History No chief complaint on file.   Patricia Carlson is a 86 y.o. female.  Patient is a 86 year old female presenting today with EMS from home where she lives by herself.  Patient's daughter gave paramedics a history that she has a history of dementia but until 2 weeks ago the patient was able to care for her self and in the last few weeks has not been able to do so.  Patient states she feels generally bad and weak.  She has had no appetite has not been eating or drinking much.  She denies any unilateral weakness or numbness.  Headache, chest pain or abdominal pain.  She denies any diarrhea or vomiting.  She does not know what medication she takes.  Attempting to call the daughter but nobody answers the phone.  The history is provided by the patient and the EMS personnel. The history is limited by the absence of a caregiver.       History reviewed. No pertinent past medical history.  There are no problems to display for this patient.   History reviewed. No pertinent surgical history.   OB History   No obstetric history on file.     No family history on file.  Social History   Tobacco Use  . Smoking status: Not on file  Substance Use Topics  . Alcohol use: Not on file  . Drug use: Not on file    Home Medications Prior to Admission medications   Not on File    Allergies    Patient has no allergy information on record.  Review of Systems   Review of Systems  Unable to perform ROS: Dementia    Physical Exam Updated Vital Signs BP 129/66   Pulse 79   Temp (!) 96.5 F (35.8 C) (Oral)   Resp (!) 23   SpO2 92%   Physical Exam Vitals and nursing note reviewed.  Constitutional:      General: She is not in acute distress.    Appearance: She is well-developed and normal weight.     Comments: Frail elderly female  HENT:     Head:  Normocephalic and atraumatic.     Mouth/Throat:     Mouth: Mucous membranes are dry.     Comments: Dry mouth with mucous on the tongue Eyes:     Pupils: Pupils are equal, round, and reactive to light.  Cardiovascular:     Rate and Rhythm: Normal rate and regular rhythm.     Heart sounds: Normal heart sounds. No murmur. No friction rub.  Pulmonary:     Effort: Pulmonary effort is normal.     Breath sounds: Normal breath sounds. No wheezing or rales.  Abdominal:     General: Bowel sounds are normal. There is no distension.     Palpations: Abdomen is soft.     Tenderness: There is no abdominal tenderness. There is no guarding or rebound.  Musculoskeletal:        General: No tenderness. Normal range of motion.     Comments: No edema  Skin:    General: Skin is warm and dry.     Findings: No rash.     Comments: Poor skin turgor  Neurological:     Mental Status: She is alert.     Cranial Nerves: No cranial nerve deficit.     Comments: Oriented to  person.  Pt has some drift in the left lower ext but no notable weakness in the bilateral upper ext and RLE.  Normal EOM without field cut.  Speech purposeful  Psychiatric:        Behavior: Behavior normal.     Comments: Calm and cooperative     ED Results / Procedures / Treatments   Labs (all labs ordered are listed, but only abnormal results are displayed) Labs Reviewed  CBC WITH DIFFERENTIAL/PLATELET - Abnormal; Notable for the following components:      Result Value   MCV 102.8 (*)    All other components within normal limits  COMPREHENSIVE METABOLIC PANEL - Abnormal; Notable for the following components:   Potassium 2.8 (*)    Glucose, Bld 105 (*)    Calcium 7.6 (*)    Total Protein 5.5 (*)    Albumin 2.9 (*)    Total Bilirubin 1.5 (*)    All other components within normal limits  RESPIRATORY PANEL BY RT PCR (FLU A&B, COVID)  URINE CULTURE  LACTIC ACID, PLASMA  URINALYSIS, ROUTINE W REFLEX MICROSCOPIC  TSH  T4     EKG EKG Interpretation  Date/Time:  Saturday February 15 2019 15:53:17 EST Ventricular Rate:  74 PR Interval:    QRS Duration: 80 QT Interval:  521 QTC Calculation: 579 R Axis:   70 Text Interpretation: Sinus rhythm Low voltage, extremity leads new Prolonged QT interval Confirmed by Gwyneth Sprout ([id number]) on 02/15/2019 4:18:44 PM   Radiology CT Head Wo Contrast  Result Date: 02/15/2019 CLINICAL DATA:  Altered mental status EXAM: CT HEAD WITHOUT CONTRAST TECHNIQUE: Contiguous axial images were obtained from the base of the skull through the vertex without intravenous contrast. COMPARISON:  None. FINDINGS: Brain: No evidence of acute infarction, hemorrhage, hydrocephalus, extra-axial collection or mass lesion/mass effect. There is mild cerebral volume loss with associated ex vacuo dilatation. Periventricular white matter hypoattenuation likely represents chronic small vessel ischemic disease. Vascular: There are vascular calcifications in the carotid siphons. Skull: Normal. Negative for fracture or focal lesion. Sinuses/Orbits: No acute finding. Other: None. IMPRESSION: 1. No acute intracranial process. Electronically Signed   By: Romona Curls M.D.   On: 02/15/2019 16:21   DG Chest Port 1 View  Result Date: 02/15/2019 CLINICAL DATA:  Weakness EXAM: PORTABLE CHEST 1 VIEW COMPARISON:  CT chest dated 02/20/2005 and chest radiograph dated 02/01/2005. FINDINGS: The heart size and mediastinal contours are within normal limits. Both lungs are clear. The visualized skeletal structures are unremarkable. IMPRESSION: No active disease. Electronically Signed   By: Romona Curls M.D.   On: 02/15/2019 16:17    Procedures Procedures (including critical care time)  Medications Ordered in ED Medications  lactated ringers bolus 1,000 mL (has no administration in time range)    ED Course  I have reviewed the triage vital signs and the nursing notes.  Pertinent labs & imaging results that were  available during my care of the patient were reviewed by me and considered in my medical decision making (see chart for details).    MDM Rules/Calculators/A&P                      Elderly female arriving today by EMS for failure to thrive.  Report by EMS as this is been worsening over the last 2 weeks.  Patient states she just feels weak all over and has a poor oral intake recently.  Concern for possible infectious etiology versus stroke versus dehydration and endorgan  damage.  She has no respiratory symptoms and lower suspicion for pneumonia, cardiac etiology and no evidence of CHF on exam at this time.  Patient appears dehydrated and vital signs show O2 sats of 92% on room air with mild tachypnea, normal blood pressure but temperature of 96.5.  Patient given IV fluids.  Imaging and labs are pending.  9:16 PM Able to contact patient's daughter [name] at [phone].  She reports that on Christmas patient was able to get up and use her cane and walk from room to room and seemed her normal self.  Over the last 1 to 2 weeks she has had a rapid decline where she stopped eating, is in her bed all the time and as of this morning could not even sit up on her own.  Normally patient lives independently her daughters do bring her food but she is able to feed herself go to the bathroom independently and does pretty well.  Daughter denies her having any known fever, pain, cough or congestion but states she is continue to look pale, sometimes blue around the lips and has just been getting weaker and weaker.  She has had no new medication changes.  Daughter has not noticed any focal weakness.  Head CT day is within normal limits, EKG without acute findings, CBC within normal limits, CMP with hypokalemia and hypocalcemia with decreased protein and albumin which may be related to poor oral intake.  Also patient's initial temperature was 96.5 degrees we will recheck and still pending a urine test.  Lactate was within normal  limits and Covid is negative.  Chest x-ray without acute findings.  After IV fluids patient does look improved but still is not able to sit up on her own.  Plan will be to admit for generalized weakness, hypokalemia, hypocalcemia.  Other labs are still pending.  Final Clinical Impression(s) / ED Diagnoses Final diagnoses:  Hypokalemia  Hypocalcemia  Dehydration    Rx / DC Orders ED Discharge Orders    None       Blanchie Dessert, MD 02/15/19 2209

## 2019-02-16 DIAGNOSIS — E43 Unspecified severe protein-calorie malnutrition: Secondary | ICD-10-CM | POA: Diagnosis present

## 2019-02-16 DIAGNOSIS — R627 Adult failure to thrive: Secondary | ICD-10-CM

## 2019-02-16 DIAGNOSIS — Z803 Family history of malignant neoplasm of breast: Secondary | ICD-10-CM | POA: Diagnosis not present

## 2019-02-16 DIAGNOSIS — R140 Abdominal distension (gaseous): Secondary | ICD-10-CM | POA: Diagnosis present

## 2019-02-16 DIAGNOSIS — Z515 Encounter for palliative care: Secondary | ICD-10-CM | POA: Diagnosis not present

## 2019-02-16 DIAGNOSIS — M2550 Pain in unspecified joint: Secondary | ICD-10-CM | POA: Diagnosis not present

## 2019-02-16 DIAGNOSIS — Z20822 Contact with and (suspected) exposure to covid-19: Secondary | ICD-10-CM | POA: Diagnosis present

## 2019-02-16 DIAGNOSIS — H9190 Unspecified hearing loss, unspecified ear: Secondary | ICD-10-CM | POA: Diagnosis present

## 2019-02-16 DIAGNOSIS — R531 Weakness: Secondary | ICD-10-CM | POA: Diagnosis not present

## 2019-02-16 DIAGNOSIS — R0902 Hypoxemia: Secondary | ICD-10-CM | POA: Diagnosis not present

## 2019-02-16 DIAGNOSIS — E876 Hypokalemia: Secondary | ICD-10-CM | POA: Diagnosis not present

## 2019-02-16 DIAGNOSIS — Z681 Body mass index (BMI) 19 or less, adult: Secondary | ICD-10-CM | POA: Diagnosis not present

## 2019-02-16 DIAGNOSIS — R64 Cachexia: Secondary | ICD-10-CM | POA: Diagnosis present

## 2019-02-16 DIAGNOSIS — Z7189 Other specified counseling: Secondary | ICD-10-CM

## 2019-02-16 DIAGNOSIS — R1310 Dysphagia, unspecified: Secondary | ICD-10-CM | POA: Diagnosis not present

## 2019-02-16 DIAGNOSIS — E860 Dehydration: Secondary | ICD-10-CM | POA: Diagnosis not present

## 2019-02-16 DIAGNOSIS — Z7401 Bed confinement status: Secondary | ICD-10-CM | POA: Diagnosis not present

## 2019-02-16 DIAGNOSIS — E785 Hyperlipidemia, unspecified: Secondary | ICD-10-CM | POA: Diagnosis present

## 2019-02-16 DIAGNOSIS — R4182 Altered mental status, unspecified: Secondary | ICD-10-CM | POA: Diagnosis not present

## 2019-02-16 DIAGNOSIS — F0390 Unspecified dementia without behavioral disturbance: Secondary | ICD-10-CM | POA: Diagnosis not present

## 2019-02-16 DIAGNOSIS — I959 Hypotension, unspecified: Secondary | ICD-10-CM | POA: Diagnosis not present

## 2019-02-16 DIAGNOSIS — R278 Other lack of coordination: Secondary | ICD-10-CM | POA: Diagnosis not present

## 2019-02-16 DIAGNOSIS — Z79899 Other long term (current) drug therapy: Secondary | ICD-10-CM | POA: Diagnosis not present

## 2019-02-16 DIAGNOSIS — Z66 Do not resuscitate: Secondary | ICD-10-CM | POA: Diagnosis present

## 2019-02-16 DIAGNOSIS — M6281 Muscle weakness (generalized): Secondary | ICD-10-CM | POA: Diagnosis not present

## 2019-02-16 DIAGNOSIS — R5381 Other malaise: Secondary | ICD-10-CM | POA: Diagnosis present

## 2019-02-16 DIAGNOSIS — R2689 Other abnormalities of gait and mobility: Secondary | ICD-10-CM | POA: Diagnosis not present

## 2019-02-16 DIAGNOSIS — R41841 Cognitive communication deficit: Secondary | ICD-10-CM | POA: Diagnosis not present

## 2019-02-16 DIAGNOSIS — I10 Essential (primary) hypertension: Secondary | ICD-10-CM | POA: Diagnosis present

## 2019-02-16 LAB — CBC
HCT: 39.5 % (ref 36.0–46.0)
Hemoglobin: 12.7 g/dL (ref 12.0–15.0)
MCH: 33 pg (ref 26.0–34.0)
MCHC: 32.2 g/dL (ref 30.0–36.0)
MCV: 102.6 fL — ABNORMAL HIGH (ref 80.0–100.0)
Platelets: 227 10*3/uL (ref 150–400)
RBC: 3.85 MIL/uL — ABNORMAL LOW (ref 3.87–5.11)
RDW: 14.6 % (ref 11.5–15.5)
WBC: 9.2 10*3/uL (ref 4.0–10.5)
nRBC: 0 % (ref 0.0–0.2)

## 2019-02-16 LAB — COMPREHENSIVE METABOLIC PANEL
ALT: 13 U/L (ref 0–44)
AST: 19 U/L (ref 15–41)
Albumin: 3.1 g/dL — ABNORMAL LOW (ref 3.5–5.0)
Alkaline Phosphatase: 67 U/L (ref 38–126)
Anion gap: 11 (ref 5–15)
BUN: 17 mg/dL (ref 8–23)
CO2: 32 mmol/L (ref 22–32)
Calcium: 7.8 mg/dL — ABNORMAL LOW (ref 8.9–10.3)
Chloride: 98 mmol/L (ref 98–111)
Creatinine, Ser: 0.74 mg/dL (ref 0.44–1.00)
GFR calc Af Amer: >60 mL/min (ref >60)
GFR calc non Af Amer: >60 mL/min (ref >60)
Glucose, Bld: 101 mg/dL — ABNORMAL HIGH (ref 70–99)
Potassium: 2.8 mmol/L — ABNORMAL LOW (ref 3.5–5.1)
Sodium: 141 mmol/L (ref 135–145)
Total Bilirubin: 0.9 mg/dL (ref 0.3–1.2)
Total Protein: 6 g/dL — ABNORMAL LOW (ref 6.5–8.1)

## 2019-02-16 LAB — URINALYSIS, ROUTINE W REFLEX MICROSCOPIC
Bacteria, UA: NONE SEEN
Bilirubin Urine: NEGATIVE
Glucose, UA: NEGATIVE mg/dL
Hgb urine dipstick: NEGATIVE
Ketones, ur: NEGATIVE mg/dL
Leukocytes,Ua: NEGATIVE
Nitrite: NEGATIVE
Protein, ur: 30 mg/dL — AB
Specific Gravity, Urine: 1.019 (ref 1.005–1.030)
pH: 6 (ref 5.0–8.0)

## 2019-02-16 LAB — PHOSPHORUS: Phosphorus: 2.4 mg/dL — ABNORMAL LOW (ref 2.5–4.6)

## 2019-02-16 LAB — CALCIUM, IONIZED: Calcium, Ionized, Serum: 4.7 mg/dL (ref 4.5–5.6)

## 2019-02-16 LAB — MAGNESIUM: Magnesium: 2.2 mg/dL (ref 1.7–2.4)

## 2019-02-16 LAB — TSH: TSH: 3.391 u[IU]/mL (ref 0.350–4.500)

## 2019-02-16 LAB — PREALBUMIN: Prealbumin: 8.6 mg/dL — ABNORMAL LOW (ref 18–38)

## 2019-02-16 MED ORDER — ENOXAPARIN SODIUM 30 MG/0.3ML ~~LOC~~ SOLN
30.0000 mg | SUBCUTANEOUS | Status: DC
  Administered 2019-02-16 – 2019-02-22 (×7): 30 mg via SUBCUTANEOUS
  Filled 2019-02-16 (×7): qty 0.3

## 2019-02-16 MED ORDER — ACETAMINOPHEN 325 MG PO TABS
650.0000 mg | ORAL_TABLET | Freq: Four times a day (QID) | ORAL | Status: DC | PRN
  Administered 2019-02-19: 650 mg via ORAL
  Filled 2019-02-16: qty 2

## 2019-02-16 MED ORDER — ACETAMINOPHEN 650 MG RE SUPP: 650.0000 mg | Freq: Four times a day (QID) | RECTAL | Status: DC | PRN

## 2019-02-16 MED ORDER — SODIUM CHLORIDE 0.9 % IV SOLN: INTRAVENOUS | Status: AC

## 2019-02-16 MED ORDER — METOPROLOL TARTRATE 5 MG/5ML IV SOLN
5.0000 mg | INTRAVENOUS | Status: DC | PRN
  Administered 2019-02-16: 5 mg via INTRAVENOUS
  Filled 2019-02-16 (×2): qty 5

## 2019-02-16 MED ORDER — ONDANSETRON HCL 4 MG PO TABS: 4.0000 mg | ORAL_TABLET | Freq: Four times a day (QID) | ORAL | Status: DC | PRN

## 2019-02-16 MED ORDER — SODIUM CHLORIDE 0.9 % IV SOLN: INTRAVENOUS | Status: DC

## 2019-02-16 MED ORDER — HALOPERIDOL 0.5 MG PO TABS
0.5000 mg | ORAL_TABLET | Freq: Four times a day (QID) | ORAL | Status: DC | PRN
  Administered 2019-02-18: 0.5 mg via ORAL
  Filled 2019-02-16 (×2): qty 1

## 2019-02-16 MED ORDER — POTASSIUM CHLORIDE 20 MEQ PO PACK
40.0000 meq | PACK | ORAL | Status: AC
  Administered 2019-02-16 (×2): 40 meq via ORAL
  Filled 2019-02-16 (×2): qty 2

## 2019-02-16 MED ORDER — ONDANSETRON HCL 4 MG/2ML IJ SOLN: 4.0000 mg | Freq: Four times a day (QID) | INTRAMUSCULAR | Status: DC | PRN

## 2019-02-16 MED ORDER — ATORVASTATIN CALCIUM 20 MG PO TABS
20.0000 mg | ORAL_TABLET | Freq: Every day | ORAL | Status: DC
  Administered 2019-02-16 – 2019-02-21 (×6): 20 mg via ORAL
  Filled 2019-02-16 (×6): qty 1

## 2019-02-16 MED ORDER — MEMANTINE HCL 10 MG PO TABS
10.0000 mg | ORAL_TABLET | Freq: Two times a day (BID) | ORAL | Status: DC
  Administered 2019-02-16 – 2019-02-22 (×13): 10 mg via ORAL
  Filled 2019-02-16 (×13): qty 1

## 2019-02-16 MED ORDER — METOPROLOL TARTRATE 25 MG PO TABS
12.5000 mg | ORAL_TABLET | Freq: Two times a day (BID) | ORAL | Status: DC
  Administered 2019-02-16 – 2019-02-22 (×10): 12.5 mg via ORAL
  Filled 2019-02-16 (×12): qty 1

## 2019-02-16 MED ORDER — AMLODIPINE BESYLATE 10 MG PO TABS
10.0000 mg | ORAL_TABLET | Freq: Every day | ORAL | Status: DC
  Administered 2019-02-16 – 2019-02-22 (×6): 10 mg via ORAL
  Filled 2019-02-16 (×7): qty 1

## 2019-02-16 MED ORDER — SIMVASTATIN 40 MG PO TABS: 40.0000 mg | ORAL_TABLET | Freq: Every day | ORAL | Status: DC

## 2019-02-16 NOTE — Consult Note (Signed)
Consultation Note Date: 02/16/2019   Patient Name: Patricia Carlson  DOB: [birth year]  MRN: [id number]  Age / Sex: 86 y.o., female  PCP: Patricia Carlson, CNM Referring Physician: [place] Eric J, DO  Reason for Consultation: Establishing goals of care  HPI/Patient Profile: 86 y.o. female  with past medical history of dementia admitted on 02/15/2019 with weakness, declining oral intake, dehydration, failure to thrive generalized weakness and electrolyte abnormalities such as hypokalemia and hypocalcemia..   Clinical Assessment and Goals of Care: [name] is 86 years old.  She was living by herself with significant help from 2 daughters who would cook for her and of would help take care of her.  Patient has past medical history significant for dementia hypertension and dyslipidemia.  Since Christmas 2020, the patient is noted to have had gradual progressive ongoing decline.  This has been both functional as well as cognitive decline.  Patient has had a reduced intake, lack of interest in eating or drinking.  She has had reduced mobility.  She was admitted to hospital medicine service.  Palliative consultation for goals of care discussions.  [name] is a little hard of hearing.  She is awake, tracks me in the room, responds appropriately when spoken to loudly.  She does not appear to be in any acute distress or discomfort.  She is able to name her daughters.  Chart reviewed.  Call placed and discussed with daughter Patricia Carlson at [phone].  Introduced myself, palliative care and goals of care discussions as follows:  Palliative medicine is specialized medical care for people living with serious illness. It focuses on providing relief from the symptoms and stress of a serious illness. The goal is to improve quality of life for both the patient and the family.  Goals of care: Broad aims of medical  therapy in relation to the patient's values and preferences. Our aim is to provide medical care aimed at enabling patients to achieve the goals that matter most to them, given the circumstances of their particular medical situation and their constraints.   Discussed with patient's daughter about her current scope of hospitalization.  She has been started on a heart healthy diet.  Her oral intake is to be monitored.  Discussed with her about physical therapy/Occupational Therapy evaluation.  She is appreciative of Occupational Therapy reaching out to her for further discussions.  She states that she had been in talks with [facility] facility for placement and would like to discuss with care management.  At home, the patient had a cane but refused to use a walker.  At home, patient did not have any aspiration episodes did not have any coughing or choking spells upon eating.  However, since the Christmas she has had steady decline and more recently, for the past week or so had shown a preference for soft foods.  She did not have any falls.  She did not complain of chest pain or shortness of breath.  Goals wishes and values important to the patient and family  as a unit attempted to be elicited.  Patient's other daughter Patricia Carlson is under medical evaluation as well currently.  Patricia Carlson is the patient's designated healthcare power of attorney agent.  HCPOA Daughter Patricia Carlson [phone].  She has 2 daughters Patricia Carlson and Nickerson.  SUMMARY OF RECOMMENDATIONS   Agree with DO NOT RESUSCITATE Goals of care discussions with patient as well as with her daughter Patricia Carlson on the phone: Continue current mode of care, encourage p.o., encourage mobility.  Consider skilled nursing facility rehabilitation attempt with outpatient palliative services following. Thank you for the consult.  Code Status/Advance Care Planning:  DNR    Symptom Management:   Continue current mode of care  Palliative Prophylaxis:    Delirium Protocol  Psycho-social/Spiritual:   Desire for further Chaplaincy support:yes  Additional Recommendations: Caregiving  Support/Resources  Prognosis:   Unable to determine  Discharge Planning: Skilled Nursing Facility for rehab with Palliative care service follow-up      Primary Diagnoses: Present on Admission: . Dementia without behavioral disturbance (HCC) . Dehydration . Hypokalemia . Hypocalcemia   I have reviewed the medical record, interviewed the patient and family, and examined the patient. The following aspects are pertinent.  Past Medical History:  Diagnosis Date  . Dementia (HCC)   . Hypertension    Social History   Socioeconomic History  . Marital status: Married    Spouse name: Not on file  . Number of children: Not on file  . Years of education: Not on file  . Highest education level: Not on file  Occupational History  . Not on file  Tobacco Use  . Smoking status: Never Smoker  . Smokeless tobacco: Never Used  Substance and Sexual Activity  . Alcohol use: Never  . Drug use: Not on file  . Sexual activity: Not on file  Other Topics Concern  . Not on file  Social History Narrative  . Not on file   Social Determinants of Health   Financial Resource Strain:   . Difficulty of Paying Living Expenses: Not on file  Food Insecurity:   . Worried About Programme researcher, broadcasting/film/video in the Last Year: Not on file  . Ran Out of Food in the Last Year: Not on file  Transportation Needs:   . Lack of Transportation (Medical): Not on file  . Lack of Transportation (Non-Medical): Not on file  Physical Activity:   . Days of Exercise per Week: Not on file  . Minutes of Exercise per Session: Not on file  Stress:   . Feeling of Stress : Not on file  Social Connections:   . Frequency of Communication with Friends and Family: Not on file  . Frequency of Social Gatherings with Friends and Family: Not on file  . Attends Religious Services: Not on file  .  Active Member of Clubs or Organizations: Not on file  . Attends Banker Meetings: Not on file  . Marital Status: Not on file   Family History  Problem Relation Age of Onset  . Breast cancer Mother   . Diabetes Neg Hx    Scheduled Meds: . amLODipine  10 mg Oral Daily  . atorvastatin  20 mg Oral QHS  . enoxaparin (LOVENOX) injection  30 mg Subcutaneous Q24H  . memantine  10 mg Oral BID  . metoprolol tartrate  12.5 mg Oral BID  . potassium chloride  40 mEq Oral Q4H   Continuous Infusions: . sodium chloride 75 mL/hr at 02/16/19 0940   PRN Meds:.acetaminophen **  OR** acetaminophen, haloperidol, metoprolol tartrate, ondansetron **OR** ondansetron (ZOFRAN) IV Medications Prior to Admission:  Prior to Admission medications   Medication Sig Start Date End Date Taking? Authorizing Provider  amLODipine (NORVASC) 10 MG tablet Take 10 mg by mouth daily. 02/04/19  Yes [provider]  donepezil (ARICEPT) 10 MG tablet Take 10 mg by mouth daily. 02/04/19  Yes [provider]  lisinopril (ZESTRIL) 10 MG tablet Take 10 mg by mouth daily. 02/04/19  Yes [provider]  memantine (NAMENDA) 10 MG tablet Take 10 mg by mouth 2 (two) times daily. 02/04/19  Yes [provider]  simvastatin (ZOCOR) 40 MG tablet Take 40 mg by mouth daily. 02/04/19  Yes [provider]   No Known Allergies Review of Systems Hard of hearing Physical Exam Elderly lady resting in bed Dry oral mucosa Awake, reasonably alert Hard of hearing but when spoken to loudly is able to understand and respond appropriately. Regular work of breathing S1-S2 Abdomen is nondistended nontender Does not have lower extremity edema  Vital Signs: BP (!) 149/74 (BP Location: Left Arm)   Pulse 76   Temp (!) 97.3 F (36.3 C) (Oral)   Resp 20   Ht 5\' 4"  (1.626 m)   Wt 49 kg   SpO2 97%   BMI 18.54 kg/m  Pain Scale: 0-10   Pain Score: 0-No pain   SpO2: SpO2: 97 % O2  Device:SpO2: 97 % O2 Flow Rate: .   IO: Intake/output summary:   Intake/Output Summary (Last 24 hours) at 02/16/2019 1259 Last data filed at 02/16/2019 04/16/2019 Gross per 24 hour  Intake 2145.71 ml  Output 100 ml  Net 2045.71 ml    LBM:   Baseline Weight: Weight: 49 kg Most recent weight: Weight: 49 kg     Palliative Assessment/Data:   Palliative performance scale 40%  Time In:  12 Time Out:  1300 Time Total: 60  Greater than 50%  of this time was spent counseling and coordinating care related to the above assessment and plan.  Signed by: [id number], MD   Please contact Palliative Medicine Team phone at [phone] for questions and concerns.  For individual provider: See [phone]

## 2019-02-16 NOTE — Progress Notes (Signed)
The order for simvastatin(Zocor) was changed to an equivalent dose of atorvastatin(Lipitor) due to the potential drug interaction with amlodipine.  When taken in combination with medications that inhibit its metabolism, simvastatin can accumulate which increases the risk of liver toxicity, myopathy, or rhabdomyolysis.  Simvastatin dose should not exceed 10mg /day in patients taking verapamil, diltiazem, fibrates, or niacin >or= 1g/day.   Simvastatin dose should not exceed 20mg /day in patients taking amlodipine, ranolazine or amiodarone.   Please consider this potential interaction at discharge.  Patricia Carlson A 02/16/2019 3:45 AM

## 2019-02-16 NOTE — Progress Notes (Signed)
Metoprolol  IV given for heart rate in the 140.s, Will monitor

## 2019-02-16 NOTE — Evaluation (Signed)
Physical Therapy Evaluation Patient Details Name: Patricia Carlson MRN: [id number] DOB: January 09, 1926 Today's Date: 02/16/2019   History of Present Illness  Pt admitted 2* increasing weakness and inability to self-care.  Pt with hx of dementia  Clinical Impression  Pt admitted as above and presenting with functional mobility limitations 2* generalized weakness, balance deficits, and poor safety awareness related to dementia.  As pt is home alone, follow up rehab at SNF level to maximize IND and safety is recommended.    Follow Up Recommendations SNF    Equipment Recommendations  None recommended by PT    Recommendations for Other Services       Precautions / Restrictions Precautions Precautions: Fall Restrictions Weight Bearing Restrictions: No      Mobility  Bed Mobility Overal bed mobility: Needs Assistance Bed Mobility: Supine to Sit     Supine to sit: Min assist     General bed mobility comments: minA for trunk managment;  Transfers Overall transfer level: Needs assistance Equipment used: None Transfers: Sit to/from [employer] Transfers Sit to Stand: Min assist;Mod assist Stand pivot transfers: Min assist;Mod assist;From elevated surface       General transfer comment: Assist to bring wt up and fwd and to balance in standing;  Stand pvt with RW   Ambulation/Gait Ambulation/Gait assistance: Min assist;Mod assist Gait Distance (Feet): 4 Feet Assistive device: Rolling walker (2 wheeled) Gait Pattern/deviations: Step-to pattern;Decreased step length - right;Decreased step length - left;Shuffle;Trunk flexed Gait velocity: decr   General Gait Details: cues for posture and position from RW; distance ltd by fatigue and onset of urinary incontinence  Stairs            Wheelchair Mobility    Modified Rankin (Stroke Patients Only)       Balance Overall balance assessment: Needs assistance Sitting-balance support: Feet supported Sitting  balance-Leahy Scale: Fair     Standing balance support: Bilateral upper extremity supported Standing balance-Leahy Scale: Poor Standing balance comment: unstable even with RW                             Pertinent Vitals/Pain Pain Assessment: No/denies pain    Home Living Family/patient expects to be discharged to:: Private residence Living Arrangements: Alone Available Help at Discharge: Family;Available PRN/intermittently Type of Home: House Home Access: Stairs to enter Entrance Stairs-Rails: Lawyer of Steps: 3 Home Layout: One level Home Equipment: Cane - single point;Walker - 2 wheels;Grab bars - toilet;Grab bars - tub/shower Additional Comments: Call placed by OT and information provided by pt's daughter [name] via telephone conversation [phone];    Prior Function Level of Independence: Needs assistance   Gait / Transfers Assistance Needed: was using   ADL's / Homemaking Assistance Needed: was independent with ADL, was sponge bathing  Comments: daughter reports returning home does not seem like a viable option anymore as it is too much work for herself and her sister     Hand Dominance   Dominant Hand: Right    Extremity/Trunk Assessment   Upper Extremity Assessment Upper Extremity Assessment: Generalized weakness    Lower Extremity Assessment Lower Extremity Assessment: Generalized weakness    Cervical / Trunk Assessment Cervical / Trunk Assessment: Kyphotic  Communication   Communication: HOH  Cognition Arousal/Alertness: Awake/alert Behavior During Therapy: Flat affect Overall Cognitive Status: History of cognitive impairments - at baseline  General Comments: pt with history of dementia at baseline;pt appeared WLF for simple ADL and mobility;      General Comments      Exercises     Assessment/Plan    PT Assessment Patient needs continued PT  services  PT Problem List Decreased strength;Decreased activity tolerance;Decreased balance;Decreased mobility;Decreased cognition;Decreased knowledge of use of DME       PT Treatment Interventions DME instruction;Gait training;Stair training;Functional mobility training;Therapeutic activities;Therapeutic exercise;Balance training;Patient/family education    PT Goals (Current goals can be found in the Care Plan section)  Acute Rehab PT Goals Patient Stated Goal: pt did not state;family goal to meet with Palliative PT Goal Formulation: With patient Time For Goal Achievement: 03/02/19 Potential to Achieve Goals: Fair    Frequency Min 2X/week   Barriers to discharge        Co-evaluation               AM-PAC PT "6 Clicks" Mobility  Outcome Measure Help needed turning from your back to your side while in a flat bed without using bedrails?: A Little Help needed moving from lying on your back to sitting on the side of a flat bed without using bedrails?: A Little Help needed moving to and from a bed to a chair (including a wheelchair)?: A Lot Help needed standing up from a chair using your arms (e.g., wheelchair or bedside chair)?: A Lot Help needed to walk in hospital room?: A Lot Help needed climbing 3-5 steps with a railing? : A Lot 6 Click Score: 14    End of Session Equipment Utilized During Treatment: Gait belt Activity Tolerance: Patient limited by fatigue Patient left: in chair;with call bell/phone within reach;with chair alarm set Nurse Communication: Mobility status PT Visit Diagnosis: Difficulty in walking, not elsewhere classified (R26.2);Muscle weakness (generalized) (M62.81)    Time: 6834-1962 PT Time Calculation (min) (ACUTE ONLY): 17 min   Charges:   PT Evaluation $PT Eval Low Complexity: 1 Low PT Treatments $Gait Training: 8-22 mins        [place] Pager [phone] Office  [phone]   Ashaya Raftery 02/16/2019, 4:35 PM

## 2019-02-16 NOTE — Progress Notes (Signed)
Pt admitted from ER, pure wick placed, IV K  infusing, heart rate up to 150's now in the 90's, mitts on bi-lat. Bed alarm set

## 2019-02-16 NOTE — Progress Notes (Addendum)
CCMD called to inform this nurse of 31 second of VTach up to 150. Pt asymptomatic, and HR back in 90's. MD [place] made aware. Will continue to monitor patient.

## 2019-02-16 NOTE — Progress Notes (Signed)
PROGRESS NOTE    JUHI LAGRANGE  TFT:[id number] DOB: [birth year] DOA: 02/15/2019 PCP: Arabella Merles, CNM    Brief Narrative:   Patricia Carlson is a 86 year old female with past medical history significant for dementia, HTN, HLD who presented from home with progressive weakness.  Family has noted progressive decline since Christmas 2020, both functionally and cognitively.  She is also had reduced oral intake, lack of interest in eating or drinking with decreased mobility.  Evaluation in the ED notable for potassium 2.8, sodium 142, creatinine 0.69, phosphorus 2.5.  CT head without acute intracranial abnormality.  Chest x-ray with no acute cardiopulmonary disease process.  KUB with nonobstructive bowel gas pattern.  Patient was referred for admission by ED physician for hypokalemia in the setting of progressive weakness, fatigue and likely failure to thrive.  Assessment & Plan:   Active Problems:   Dementia without behavioral disturbance (HCC)   Dehydration   Hypokalemia   Hypocalcemia   Hypokalemia Potassium 2.8 on admission, was given IV KCl 10 mEq x 4.  Repeat potassium level this morning remains at 2.8.  Magnesium 2.4. --Continue replacement with KCl 40 mEq p.o. x2 today --Repeat electrolytes in a.m. to include magnesium  Adult failure to thrive History of advanced dementia Patient presenting from home, with history of advanced dementia; apparently significant decline cognitively and functionally since Christmas 2020.  Has been living home alone with family helping with cooking.  Was found to be significantly dehydrated, unlikely taking her home medications. --Palliative care consulted and following, appreciate assistance --Seen by OT today with recommendations of SNF placement --Transitions of care team for coordination --Recommend outpatient palliative service to follow --Namenda 10 mg p.o. twice daily --Haldol 0.5 mg p.o. every 6 hours as needed for agitation  --Continue NS at 75 mL's per hour  Weakness, fatigue, debility --PT consult pending, OT recommends SNF placement. --Continue therapy efforts while inpatient --Plan discharge to SNF with palliative services to follow outpatient  Essential hypertension BP 128/76, well controlled. --Continue amlodipine 10 mg p.o. daily, metoprolol tartrate 12.5 mg p.o. twice daily  Hyperlipidemia: Atorvastatin 20 mg p.o. nightly   DVT prophylaxis: Lovenox Code Status: DNR Family Communication: Updated patient's daughter, [name] via telephone this afternoon Disposition Plan: Continue inpatient, pending PT evaluation, will need SNF placement, transition of care team for coordination   Consultants:   Palliative care  Procedures:   None  Antimicrobials:   None   Subjective: Patient seen and examined at bedside, pleasantly confused.  No family present.  No specific complaints at this time.  Very thin, cachectic and dehydrated on appearance.  Denies shortness of breath, no chest pain, no abdominal pain.  Some concern this morning for agitation, although seems to have now resolved.  No other acute concerns overnight per nursing staff.  Objective: Vitals:   02/16/19 0034 02/16/19 0200 02/16/19 0521 02/16/19 0828  BP:   128/76 (!) 149/74  Pulse:  90 (!) 131 76  Resp:  17 18 20   Temp:   97.7 F (36.5 C) (!) 97.3 F (36.3 C)  TempSrc:    Oral  SpO2:   96% 97%  Weight: 49 kg     Height: 5\' 4"  (1.626 m)       Intake/Output Summary (Last 24 hours) at 02/16/2019 1356 Last data filed at 02/16/2019 0958 Gross per 24 hour  Intake 2145.71 ml  Output 100 ml  Net 2045.71 ml   Filed Weights   02/16/19 0034  Weight: 49 kg  Examination:  General exam: Appears calm and comfortable, lying in bed, thin/cachectic in appearance Respiratory system: Clear to auscultation. Respiratory effort normal.  Oxygenating well on room air Cardiovascular system: S1 & S2 heard, RRR. No JVD, murmurs, rubs,  gallops or clicks. No pedal edema. Gastrointestinal system: Abdomen is nondistended, soft and nontender. No organomegaly or masses felt. Normal bowel sounds heard. Central nervous system: Alert, not oriented to person/place/time. No focal neurological deficits. Extremities: Symmetric 5 x 5 power. Skin: No rashes, lesions or ulcers Psychiatry: Judgement and insight appear poor.  Depressed mood & flat affect.     Data Reviewed: I have personally reviewed following labs and imaging studies  CBC: Recent Labs  Lab 02/15/19 1547 02/16/19 0504  WBC 7.3 9.2  NEUTROABS 5.9  --   HGB 14.2 12.7  HCT 44.5 39.5  MCV 102.8* 102.6*  PLT 155 227   Basic Metabolic Panel: Recent Labs  Lab 02/15/19 1801 02/15/19 2234 02/16/19 0504  NA 142  --  141  K 2.8*  --  2.8*  CL 98  --  98  CO2 31  --  32  GLUCOSE 105*  --  101*  BUN 19  --  17  CREATININE 0.69  --  0.74  CALCIUM 7.6*  --  7.8*  MG  --  2.3 2.2  PHOS  --  2.5 2.4*   GFR: Estimated Creatinine Clearance: 35.4 mL/min (by C-G formula based on SCr of 0.74 mg/dL). Liver Function Tests: Recent Labs  Lab 02/15/19 1801 02/16/19 0504  AST 19 19  ALT 10 13  ALKPHOS 56 67  BILITOT 1.5* 0.9  PROT 5.5* 6.0*  ALBUMIN 2.9* 3.1*   No results for input(s): LIPASE, AMYLASE in the last 168 hours. No results for input(s): AMMONIA in the last 168 hours. Coagulation Profile: No results for input(s): INR, PROTIME in the last 168 hours. Cardiac Enzymes: Recent Labs  Lab 02/15/19 2234  CKTOTAL 40   BNP (last 3 results) No results for input(s): PROBNP in the last 8760 hours. HbA1C: No results for input(s): HGBA1C in the last 72 hours. CBG: No results for input(s): GLUCAP in the last 168 hours. Lipid Profile: No results for input(s): CHOL, HDL, LDLCALC, TRIG, CHOLHDL, LDLDIRECT in the last 72 hours. Thyroid Function Tests: Recent Labs    02/16/19 0504  TSH 3.391   Anemia Panel: No results for input(s): VITAMINB12, FOLATE,  FERRITIN, TIBC, IRON, RETICCTPCT in the last 72 hours. Sepsis Labs: Recent Labs  Lab 02/15/19 1547  LATICACIDVEN 1.8    Recent Results (from the past 240 hour(s))  Respiratory Panel by RT PCR (Flu A&B, Covid) - Nasopharyngeal Swab     Status: None   Collection Time: 02/15/19  3:47 PM   Specimen: Nasopharyngeal Swab  Result Value Ref Range Status   SARS Coronavirus 2 by RT PCR NEGATIVE NEGATIVE Final    Comment: (NOTE) SARS-CoV-2 target nucleic acids are NOT DETECTED. The SARS-CoV-2 RNA is generally detectable in upper respiratoy specimens during the acute phase of infection. The lowest concentration of SARS-CoV-2 viral copies this assay can detect is 131 copies/mL. A negative result does not preclude SARS-Cov-2 infection and should not be used as the sole basis for treatment or other patient management decisions. A negative result may occur with  improper specimen collection/handling, submission of specimen other than nasopharyngeal swab, presence of viral mutation(s) within the areas targeted by this assay, and inadequate number of viral copies (<131 copies/mL). A negative result must be combined with  clinical observations, patient history, and epidemiological information. The expected result is Negative. Fact Sheet for Patients:  PinkCheek.be Fact Sheet for Healthcare Providers:  GravelBags.it This test is not yet ap proved or cleared by the [place] FDA and  has been authorized for detection and/or diagnosis of SARS-CoV-2 by FDA under an Emergency Use Authorization (EUA). This EUA will remain  in effect (meaning this test can be used) for the duration of the COVID-19 declaration under Section 564(b)(1) of the Act, 21 U.S.C. section 360bbb-3(b)(1), unless the authorization is terminated or revoked sooner.    Influenza A by PCR NEGATIVE NEGATIVE Final   Influenza B by PCR NEGATIVE NEGATIVE Final    Comment:  (NOTE) The Xpert Xpress SARS-CoV-2/FLU/RSV assay is intended as an aid in  the diagnosis of influenza from Nasopharyngeal swab specimens and  should not be used as a sole basis for treatment. Nasal washings and  aspirates are unacceptable for Xpert Xpress SARS-CoV-2/FLU/RSV  testing. Fact Sheet for Patients: PinkCheek.be Fact Sheet for Healthcare Providers: GravelBags.it This test is not yet approved or cleared by the [place] FDA and  has been authorized for detection and/or diagnosis of SARS-CoV-2 by  FDA under an Emergency Use Authorization (EUA). This EUA will remain  in effect (meaning this test can be used) for the duration of the  Covid-19 declaration under Section 564(b)(1) of the Act, 21  U.S.C. section 360bbb-3(b)(1), unless the authorization is  terminated or revoked. Performed at [facility], [address], [place], [place] [address]          Radiology Studies: DG Abd 1 View  Result Date: 02/15/2019 CLINICAL DATA:  Diarrhea EXAM: ABDOMEN - 1 VIEW COMPARISON:  None. FINDINGS: The bowel gas pattern is normal. No radio-opaque calculi or other significant radiographic abnormality are seen. There is a dextroconvex scoliotic curvature of the lumbar spine. IMPRESSION: Nonobstructive bowel gas pattern. Electronically Signed   By: Prudencio Pair M.D.   On: 02/15/2019 22:55   CT Head Wo Contrast  Result Date: 02/15/2019 CLINICAL DATA:  Altered mental status EXAM: CT HEAD WITHOUT CONTRAST TECHNIQUE: Contiguous axial images were obtained from the base of the skull through the vertex without intravenous contrast. COMPARISON:  None. FINDINGS: Brain: No evidence of acute infarction, hemorrhage, hydrocephalus, extra-axial collection or mass lesion/mass effect. There is mild cerebral volume loss with associated ex vacuo dilatation. Periventricular white matter hypoattenuation likely represents chronic small  vessel ischemic disease. Vascular: There are vascular calcifications in the carotid siphons. Skull: Normal. Negative for fracture or focal lesion. Sinuses/Orbits: No acute finding. Other: None. IMPRESSION: 1. No acute intracranial process. Electronically Signed   By: Zerita Boers M.D.   On: 02/15/2019 16:21   DG Chest Port 1 View  Result Date: 02/15/2019 CLINICAL DATA:  Weakness EXAM: PORTABLE CHEST 1 VIEW COMPARISON:  CT chest dated 02/20/2005 and chest radiograph dated 02/01/2005. FINDINGS: The heart size and mediastinal contours are within normal limits. Both lungs are clear. The visualized skeletal structures are unremarkable. IMPRESSION: No active disease. Electronically Signed   By: Zerita Boers M.D.   On: 02/15/2019 16:17        Scheduled Meds: . amLODipine  10 mg Oral Daily  . atorvastatin  20 mg Oral QHS  . enoxaparin (LOVENOX) injection  30 mg Subcutaneous Q24H  . memantine  10 mg Oral BID  . metoprolol tartrate  12.5 mg Oral BID   Continuous Infusions: . sodium chloride 75 mL/hr at 02/16/19 0940  LOS: 0 days    Time spent: 36 minutes spent on chart review, discussion with nursing staff, consultants, updating family and interview/physical exam; more than 50% of that time was spent in counseling and/or coordination of care.    Alvira Philips [place], DO [facility] 02/16/2019, 1:56 PM

## 2019-02-16 NOTE — Plan of Care (Signed)
Pt confused x 3

## 2019-02-16 NOTE — Progress Notes (Signed)
Occupational Therapy Evaluation Patient Details Name: Patricia Carlson MRN: [id number] DOB: 1925/10/25 Today's Date: 02/16/2019    History of Present Illness Patricia Carlson is a 86 y.o. female with medical history significant of dementia, HTN, HLD   Clinical Impression   Information provided by pt's daughter, Patricia Carlson via telephone conversation. PTA, pt was living at home alone with her daughters there with her during pt's waking hours, pt was indepenedent with ADL/IADL and ambulated at spc level. Patricia Carlson reports pt has been steadily deconditioning since Christmas. Pt currently requires minA to progress to EOB and minA for stability sitting EOB, she required assistance for LB dressing and declined further mobility stating "I feel bad, I want to lay back down". Pt demonstrates decreased activity tolerance and deconditioning. Due to decline in current level of function, pt would benefit from acute OT to address established goals to facilitate safe D/C to venue listed below. At this time, recommend SNF follow-up. Patricia Carlson reports daughter reports returning home does not seem like a viable option anymore as it is too much work for herself and her sister. Will continue to follow acutely.     Follow Up Recommendations  SNF;Supervision/Assistance - 24 hour    Equipment Recommendations  3 in 1 bedside commode    Recommendations for Other Services Other (comment)(Palliative)     Precautions / Restrictions Precautions Precautions: Fall Restrictions Weight Bearing Restrictions: No      Mobility Bed Mobility Overal bed mobility: Needs Assistance Bed Mobility: Supine to Sit;Sit to Supine     Supine to sit: Min assist Sit to supine: Min assist   General bed mobility comments: minA for trunk managment;  Transfers Overall transfer level: Needs assistance Equipment used: None Transfers: Lateral/Scoot Transfers          Lateral/Scoot Transfers: Mod assist General transfer  comment: modA to scoot laterally toward [facility]    Balance Overall balance assessment: Needs assistance Sitting-balance support: Bilateral upper extremity supported;Feet supported Sitting balance-Leahy Scale: Poor Sitting balance - Comments: minA for stability sitting EOB Postural control: Right lateral lean                                 ADL either performed or assessed with clinical judgement   ADL Overall ADL's : Needs assistance/impaired Eating/Feeding: Set up;Sitting   Grooming: Minimal assistance;Sitting Grooming Details (indicate cue type and reason): minA for stability;minA for thorough completion  Upper Body Bathing: Minimal assistance   Lower Body Bathing: Minimal assistance;Sitting/lateral leans   Upper Body Dressing : Minimal assistance;Sitting   Lower Body Dressing: Minimal assistance;Sitting/lateral leans Lower Body Dressing Details (indicate cue type and reason): pt required assistance to don socks sitting EOB               General ADL Comments: pt tolerated sitting EOB <5 min frequently stating "I feel bad" "I want to lay back down" pt declined further mobility at this time; she required minA for stability sitting EOB;pt limited by decreased strength, decreased activity tolerance, and cognition     Vision Patient Visual Report: No change from baseline       Perception     Praxis      Pertinent Vitals/Pain Pain Assessment: No/denies pain     Hand Dominance Right   Extremity/Trunk Assessment Upper Extremity Assessment Upper Extremity Assessment: Generalized weakness   Lower Extremity Assessment Lower Extremity Assessment: Generalized weakness   Cervical / Trunk Assessment Cervical /  Trunk Assessment: Kyphotic   Communication Communication Communication: HOH   Cognition Arousal/Alertness: Awake/alert Behavior During Therapy: Flat affect Overall Cognitive Status: History of cognitive impairments - at baseline                                  General Comments: pt with history of dementia at baseline;pt appeared WLF for simple ADL and mobility;   General Comments       Exercises     Shoulder Instructions      Home Living Family/patient expects to be discharged to:: Private residence Living Arrangements: Alone Available Help at Discharge: Family;Available PRN/intermittently(throughout waking hours) Type of Home: House Home Access: Stairs to enter [employer] of Steps: 3 Entrance Stairs-Rails: Left;Right Home Layout: One level     Bathroom Shower/Tub: (pt was sponge bathing)   Bathroom Toilet: Standard Bathroom Accessibility: Yes How Accessible: Accessible via walker Home Equipment: Cane - single point;Walker - 2 wheels;Grab bars - toilet;Grab bars - tub/shower   Additional Comments: information provided by pt's daughter Patricia Carlson via telephone conversation [phone];      Prior Functioning/Environment Level of Independence: Needs assistance  Gait / Transfers Assistance Needed: was using  ADL's / Homemaking Assistance Needed: was independent with ADL, was sponge bathing   Comments: daughter reports returning home does not seem like a viable option anymore as it is too much work for herself and her sister        OT Problem List: Decreased activity tolerance;Impaired balance (sitting and/or standing);Decreased cognition;Decreased safety awareness;Decreased knowledge of use of DME or AE;Decreased knowledge of precautions      OT Treatment/Interventions: Self-care/ADL training;Therapeutic exercise;Energy conservation;DME and/or AE instruction;Therapeutic activities;Cognitive remediation/compensation;Patient/family education;Balance training    OT Goals(Current goals can be found in the care plan section) Acute Rehab OT Goals Patient Stated Goal: pt did not state;family goal to meet with Palliative OT Goal Formulation: With family Time For Goal Achievement:  03/02/19 Potential to Achieve Goals: Fair ADL Goals Pt Will Perform Grooming: with set-up;standing;sitting Pt Will Perform Upper Body Dressing: with set-up Pt Will Transfer to Toilet: with supervision;ambulating Pt Will Perform Toileting - Clothing Manipulation and hygiene: with supervision;sit to/from stand  OT Frequency: Min 2X/week   Barriers to D/C: Decreased caregiver support  pt's daughters work       Co-evaluation              AM-PAC OT "6 Clicks" Daily Activity     Outcome Measure Help from another person eating meals?: A Little Help from another person taking care of personal grooming?: A Little Help from another person toileting, which includes using toliet, bedpan, or urinal?: A Lot Help from another person bathing (including washing, rinsing, drying)?: A Little Help from another person to put on and taking off regular upper body clothing?: A Little Help from another person to put on and taking off regular lower body clothing?: Total 6 Click Score: 15   End of Session Nurse Communication: Mobility status  Activity Tolerance: Patient limited by fatigue Patient left: in bed;with call bell/phone within reach;with bed alarm set  OT Visit Diagnosis: Unsteadiness on feet (R26.81);Other abnormalities of gait and mobility (R26.89);Muscle weakness (generalized) (M62.81);Other symptoms and signs involving cognitive function                Time: 1023-1040 OT Time Calculation (min): 17 min Charges:  OT General Charges $OT Visit: 1 Visit OT Evaluation $OT Eval Moderate  Complexity: 1 Mod  Diona Browner OTR/L Acute Rehabilitation Services Office: [phone]   Rebeca Alert 02/16/2019, 12:17 PM

## 2019-02-17 DIAGNOSIS — R627 Adult failure to thrive: Secondary | ICD-10-CM | POA: Diagnosis present

## 2019-02-17 DIAGNOSIS — Z515 Encounter for palliative care: Secondary | ICD-10-CM

## 2019-02-17 DIAGNOSIS — E43 Unspecified severe protein-calorie malnutrition: Secondary | ICD-10-CM

## 2019-02-17 LAB — COMPREHENSIVE METABOLIC PANEL
ALT: 11 U/L (ref 0–44)
AST: 13 U/L — ABNORMAL LOW (ref 15–41)
Albumin: 3.2 g/dL — ABNORMAL LOW (ref 3.5–5.0)
Alkaline Phosphatase: 67 U/L (ref 38–126)
Anion gap: 13 (ref 5–15)
BUN: 15 mg/dL (ref 8–23)
CO2: 27 mmol/L (ref 22–32)
Calcium: 8 mg/dL — ABNORMAL LOW (ref 8.9–10.3)
Chloride: 97 mmol/L — ABNORMAL LOW (ref 98–111)
Creatinine, Ser: 0.52 mg/dL (ref 0.44–1.00)
GFR calc Af Amer: >60 mL/min (ref >60)
GFR calc non Af Amer: >60 mL/min (ref >60)
Glucose, Bld: 102 mg/dL — ABNORMAL HIGH (ref 70–99)
Potassium: 3.6 mmol/L (ref 3.5–5.1)
Sodium: 137 mmol/L (ref 135–145)
Total Bilirubin: 0.9 mg/dL (ref 0.3–1.2)
Total Protein: 6 g/dL — ABNORMAL LOW (ref 6.5–8.1)

## 2019-02-17 LAB — CBC
HCT: 41.9 % (ref 36.0–46.0)
Hemoglobin: 13.9 g/dL (ref 12.0–15.0)
MCH: 33.6 pg (ref 26.0–34.0)
MCHC: 33.2 g/dL (ref 30.0–36.0)
MCV: 101.2 fL — ABNORMAL HIGH (ref 80.0–100.0)
Platelets: 233 10*3/uL (ref 150–400)
RBC: 4.14 MIL/uL (ref 3.87–5.11)
RDW: 14.8 % (ref 11.5–15.5)
WBC: 7 10*3/uL (ref 4.0–10.5)
nRBC: 0 % (ref 0.0–0.2)

## 2019-02-17 LAB — URINE CULTURE

## 2019-02-17 LAB — T4: T4, Total: 5.6 ug/dL (ref 4.5–12.0)

## 2019-02-17 LAB — PHOSPHORUS: Phosphorus: 2.4 mg/dL — ABNORMAL LOW (ref 2.5–4.6)

## 2019-02-17 LAB — MAGNESIUM: Magnesium: 2.2 mg/dL (ref 1.7–2.4)

## 2019-02-17 MED ORDER — POTASSIUM PHOSPHATES 15 MMOLE/5ML IV SOLN
20.0000 mmol | Freq: Once | INTRAVENOUS | Status: AC
  Administered 2019-02-17: 20 mmol via INTRAVENOUS
  Filled 2019-02-17: qty 6.67

## 2019-02-17 MED ORDER — POTASSIUM CHLORIDE 20 MEQ PO PACK
40.0000 meq | PACK | Freq: Once | ORAL | Status: AC
  Administered 2019-02-17: 40 meq via ORAL
  Filled 2019-02-17: qty 2

## 2019-02-17 MED ORDER — ENSURE ENLIVE PO LIQD
237.0000 mL | Freq: Two times a day (BID) | ORAL | Status: DC
  Administered 2019-02-18 – 2019-02-22 (×7): 237 mL via ORAL

## 2019-02-17 NOTE — NC FL2 (Signed)
[place] MEDICAID FL2 LEVEL OF CARE SCREENING TOOL     IDENTIFICATION  Patient Name: Patricia Carlson Birthdate: 09-21-1925 Sex: female Admission Date (Current Location): 02/15/2019  [facility] and [place] Number:  Herbalist and Address:  [facility],  [place], [place]      Provider Number: [id number]  Attending Physician Name and Address:  [place] Eric J, DO  Relative Name and Phone Number:  [name] [phone]    Current Level of Care: Hospital Recommended Level of Care: [place] Prior Approval Number:    Date Approved/Denied:   PASRR Number:    Discharge Plan: Other (Comment)(ALF)    Current Diagnoses: Patient Active Problem List   Diagnosis Date Noted  . Adult failure to thrive 02/17/2019  . Severe protein-calorie malnutrition ([place]) 02/17/2019  . Hypophosphatemia 02/17/2019  . Palliative care encounter 02/17/2019  . Dementia without behavioral disturbance ([place]) 02/15/2019  . Dehydration 02/15/2019  . Hypokalemia 02/15/2019    Orientation RESPIRATION BLADDER Height & Weight     Self  Normal Incontinent Weight: 49 kg Height:  5\' 4"  (162.6 cm)  BEHAVIORAL SYMPTOMS/MOOD NEUROLOGICAL BOWEL NUTRITION STATUS      Incontinent Diet(Regular)  AMBULATORY STATUS COMMUNICATION OF NEEDS Skin   Limited Assist Verbally Normal                       Personal Care Assistance Level of Assistance  Bathing, Feeding, Dressing Bathing Assistance: Limited assistance Feeding assistance: Limited assistance Dressing Assistance: Limited assistance     Functional Limitations Info  Sight, Hearing, Speech Sight Info: Adequate Hearing Info: Adequate Speech Info: Adequate    SPECIAL CARE FACTORS FREQUENCY  PT (By licensed PT), OT (By licensed OT)     PT Frequency: 5x week OT Frequency: 5x week            Contractures      Additional Factors Info  Code Status Code Status Info: DNR            Current Medications (02/17/2019):  This is the current hospital active medication list Current Facility-Administered Medications  Medication Dose Route Frequency Provider Last Rate Last Admin  . 0.9 %  sodium chloride infusion   Intravenous Continuous Toy Baker, MD 75 mL/hr at 02/16/19 0940 New Bag at 02/16/19 0940  . acetaminophen (TYLENOL) tablet 650 mg  650 mg Oral Q6H PRN Toy Baker, MD       Or  . acetaminophen (TYLENOL) suppository 650 mg  650 mg Rectal Q6H PRN Doutova, Anastassia, MD      . amLODipine (NORVASC) tablet 10 mg  10 mg Oral Daily Doutova, Anastassia, MD   10 mg at 02/17/19 1318  . atorvastatin (LIPITOR) tablet 20 mg  20 mg Oral QHS Polly Cobia, RPH   20 mg at 02/16/19 2148  . enoxaparin (LOVENOX) injection 30 mg  30 mg Subcutaneous Q24H Doutova, Anastassia, MD   30 mg at 02/17/19 1326  . feeding supplement (ENSURE ENLIVE) (ENSURE ENLIVE) liquid 237 mL  237 mL Oral BID BM [place] Eric J, DO      . haloperidol (HALDOL) tablet 0.5 mg  0.5 mg Oral Q6H PRN [place] Eric J, DO      . memantine (NAMENDA) tablet 10 mg  10 mg Oral BID Toy Baker, MD   10 mg at 02/17/19 1317  . metoprolol tartrate (LOPRESSOR) injection 5 mg  5 mg Intravenous Q5 min PRN Blount, Lolita Cram,  NP   5 mg at 02/16/19 0312  . metoprolol tartrate (LOPRESSOR) tablet 12.5 mg  12.5 mg Oral BID [place], Eric J, DO   12.5 mg at 02/17/19 1317  . ondansetron (ZOFRAN) tablet 4 mg  4 mg Oral Q6H PRN Therisa Doyne, MD       Or  . ondansetron (ZOFRAN) injection 4 mg  4 mg Intravenous Q6H PRN Doutova, Anastassia, MD      . potassium PHOSPHATE 20 mmol in dextrose 5 % 500 mL infusion  20 mmol Intravenous Once [place], Alvira Philips, DO         Discharge Medications: Please see discharge summary for a list of discharge medications.  Relevant Imaging Results:  Relevant Lab Results:   Additional Information ss#[id number], [place], [place]

## 2019-02-17 NOTE — Evaluation (Signed)
Clinical/Bedside Swallow Evaluation Patient Details  Name: Patricia Carlson MRN: [id number] Date of Birth: Jul 03, 1925  Today's Date: 02/17/2019 Time: SLP Start Time (ACUTE ONLY): 1145 SLP Stop Time (ACUTE ONLY): 1205 SLP Time Calculation (min) (ACUTE ONLY): 20 min  Past Medical History:  Past Medical History:  Diagnosis Date  . Dementia (HCC)   . Hypertension    Past Surgical History: History reviewed. No pertinent surgical history. HPI:  86 yo female adm to [facility] with decreased ability to care for herself, debility, hypokalemia and AFTT.  Pt has dementia.  Swallow eval ordered.  She is on a HH diet currently.   Assessment / Plan / Recommendation Clinical Impression  Functional oropharyngeal swallow ability based on clinical evaluation - No s/s of aspiration with all po intake including graham cracker, icecream and water.  Pt without oral residuals and swallow was timely with appearance of adequate laryngeal elevation observed at bedside.  Recommend to continue regular diet having pt help self feed due to her weakness.  Of note, pt denies any h/o coughing, choking with intake - she may not be a good historian given she has dementia.  SLP to sign off.  Thanks for this consult. SLP Visit Diagnosis: Dysphagia, oral phase (R13.11)    Aspiration Risk  Mild aspiration risk    Diet Recommendation Regular;Thin liquid   Liquid Administration via: Cup;Straw Medication Administration: (as tolerate) Supervision: Staff to assist with self feeding Compensations: Minimize environmental distractions;Slow rate;Small sips/bites Postural Changes: Seated upright at 90 degrees;Remain upright for at least 30 minutes after po intake    Other  Recommendations Oral Care Recommendations: Oral care BID   Follow up Recommendations None      Frequency and Duration n/a      Prognosis   n/a     Swallow Study   General Date of Onset: 02/17/19 HPI: 86 yo female adm to [facility] with decreased ability to  care for herself, debility, hypokalemia and AFTT.  Pt has dementia.  Swallow eval ordered.  She is on a HH diet currently. Type of Study: Bedside Swallow Evaluation Diet Prior to this Study: Regular Temperature Spikes Noted: No Respiratory Status: Room air History of Recent Intubation: No Behavior/Cognition: Alert;Cooperative;Requires cueing(hoh) Oral Cavity Assessment: Within Functional Limits Oral Care Completed by SLP: No Oral Cavity - Dentition: Adequate natural dentition Vision: Functional for self-feeding Self-Feeding Abilities: Needs assist(can feed self with assist to steady food items) Patient Positioning: Upright in bed Baseline Vocal Quality: Low vocal intensity Volitional Cough: Cognitively unable to elicit Volitional Swallow: Unable to elicit    Oral/Motor/Sensory Function     Ice Chips Ice chips: Not tested   Thin Liquid Thin Liquid: Within functional limits Presentation: Straw    Nectar Thick Nectar Thick Liquid: Not tested   Honey Thick Honey Thick Liquid: Not tested   Puree Puree: Within functional limits Presentation: Spoon   Solid     Solid: Within functional limits Presentation: Self Orvan July 02/17/2019,12:23 PM    Rolena Infante, MS [facility] SLP Acute Rehab Services Office [phone]

## 2019-02-17 NOTE — TOC Initial Note (Signed)
Transition of Care [facility]) - Initial/Assessment Note    Patient Details  Name: Patricia Carlson MRN: [id number] Date of Birth: 06-May-1925  Transition of Care [facility] CM/SW Contact:    Lanier Clam, RN Phone Number: 02/17/2019, 2:54 PM  Clinical Narrative:Patient w/dementia,spoke to dtr Raynaldo Opitz has been talking to [facility] Cl[facility] rep Erin McNeil-CM called & left vm [phone]-await call back if patient able to come their. PT-recc SNF. Per palliative note recc Personal Care Services.                   Expected Discharge Plan: Assisted Living Barriers to Discharge: Continued Medical Work up   Patient Goals and CMS Choice   CMS Medicare.gov Compare Post Acute Care list provided to:: Patient Represenative (must comment)(dtr [name] [phone])    Expected Discharge Plan and Services Expected Discharge Plan: Assisted Living                                              Prior Living Arrangements/Services                       Activities of Daily Living Home Assistive Devices/Equipment: Dan Humphreys (specify type) ADL Screening (condition at time of admission) Patient's cognitive ability adequate to safely complete daily activities?: No Is the patient deaf or have difficulty hearing?: Yes Does the patient have difficulty seeing, even when wearing glasses/contacts?: No Does the patient have difficulty concentrating, remembering, or making decisions?: Yes Patient able to express need for assistance with ADLs?: No Does the patient have difficulty dressing or bathing?: Yes Independently performs ADLs?: No Does the patient have difficulty walking or climbing stairs?: Yes Weakness of Legs: Both Weakness of Arms/Hands: None  Permission Sought/Granted                  Emotional Assessment              Admission diagnosis:  Hypocalcemia [E83.51] Diarrhea [R19.7] Dehydration [E86.0] Hypokalemia [E87.6] Patient Active Problem List   Diagnosis Date  Noted  . Adult failure to thrive 02/17/2019  . Severe protein-calorie malnutrition (HCC) 02/17/2019  . Hypophosphatemia 02/17/2019  . Palliative care encounter 02/17/2019  . Dementia without behavioral disturbance (HCC) 02/15/2019  . Dehydration 02/15/2019  . Hypokalemia 02/15/2019   PCP:  Lupita Raider, MD Pharmacy:   [facility] [phone] - [place], [place] - [address] AT [facility] [facility] [address] [place] [address] Phone: [phone] Fax: [phone]     Social Determinants of Health (SDOH) Interventions    Readmission Risk Interventions No flowsheet data found.

## 2019-02-17 NOTE — Progress Notes (Signed)
Daily Progress Note   Patient Name: Patricia Carlson       Date: 02/17/2019 DOB: 10/08/1925  Age: 86 y.o. MRN#: [id number] Attending Physician: [place], Eric J, DO Primary Care Physician: Lupita Raider, MD Admit Date: 02/15/2019  Reason for Consultation/Follow-up: Establishing goals of care  Subjective:  patient resting comfortably, in no distress.   Length of Stay: 1  Current Medications: Scheduled Meds:  . amLODipine  10 mg Oral Daily  . atorvastatin  20 mg Oral QHS  . enoxaparin (LOVENOX) injection  30 mg Subcutaneous Q24H  . memantine  10 mg Oral BID  . metoprolol tartrate  12.5 mg Oral BID  . potassium chloride  40 mEq Oral Once    Continuous Infusions: . sodium chloride 75 mL/hr at 02/16/19 0940    PRN Meds: acetaminophen **OR** acetaminophen, haloperidol, metoprolol tartrate, ondansetron **OR** ondansetron (ZOFRAN) IV  Physical Exam         Appears weak Hard of hearing Pleasantly confused Cachectic appearance S1 S2 Awake alert, not oriented to place/time. Is able to state both daughter's names Abdomen not distended, not tender Regular work of breathing.   Vital Signs: BP 129/69 (BP Location: Left Arm)   Pulse 70   Temp (!) 97.2 F (36.2 C) (Oral)   Resp 20   Ht 5\' 4"  (1.626 m)   Wt 49 kg   SpO2 96%   BMI 18.54 kg/m  SpO2: SpO2: 96 % O2 Device: O2 Device: Room Air O2 Flow Rate:    Intake/output summary:   Intake/Output Summary (Last 24 hours) at 02/17/2019 1105 Last data filed at 02/17/2019 0500 Gross per 24 hour  Intake 818.78 ml  Output 150 ml  Net 668.78 ml   LBM:   Baseline Weight: Weight: 49 kg Most recent weight: Weight: 49 kg       Palliative Assessment/Data:      Patient Active Problem List   Diagnosis Date Noted  . Dementia  without behavioral disturbance (HCC) 02/15/2019  . Dehydration 02/15/2019  . Hypokalemia 02/15/2019  . Hypocalcemia 02/15/2019    Palliative Care Assessment & Plan   Patient Profile:  Patricia Carlson is a 86 year old female with past medical history significant for dementia, HTN, HLD who presented from home with progressive weakness.  Family has noted progressive decline since Christmas 2020, both functionally  and cognitively.  She is also had reduced oral intake, lack of interest in eating or drinking with decreased mobility.  Assessment:  failure to thrive Advanced dementia Weakness fatigue debility HTN   Recommendations/Plan:  Continue current mode of care  Agree with DNR PT notes reviewed, agree with recommendations for SNF.   Recommend SNF rehab with palliative on discharge. Daughter was already in talks with [place] facility and would like to discuss with our transitions of care team further.     Code Status:    Code Status Orders  (From admission, onward)         Start     Ordered   02/16/19 0112  Do not attempt resuscitation (DNR)  Continuous    Question Answer Comment  In the event of cardiac or respiratory ARREST Do not call a "code blue"   In the event of cardiac or respiratory ARREST Do not perform Intubation, CPR, defibrillation or ACLS   In the event of cardiac or respiratory ARREST Use medication by any route, position, wound care, and other measures to relive pain and suffering. May use oxygen, suction and manual treatment of airway obstruction as needed for comfort.      02/16/19 0111        Code Status History    This patient has a current code status but no historical code status.   Advance Care Planning Activity       Prognosis:   < 12 months is possible.   Discharge Planning:  [place] for rehab with Palliative care service follow-up  Care plan was discussed with  Daughter on the phone on 1-10.   Thank you for  allowing the Palliative Medicine Team to assist in the care of this patient.   Time In: 9 Time Out: 9.25 Total Time 25 Prolonged Time Billed  no       Greater than 50%  of this time was spent counseling and coordinating care related to the above assessment and plan.  Loistine Chance, MD  Please contact Palliative Medicine Team phone at [phone] for questions and concerns.

## 2019-02-17 NOTE — Progress Notes (Signed)
Initial Nutrition Assessment  DOCUMENTATION CODES:   Underweight  INTERVENTION:  - will order Ensure Enlive BID, each supplement provides 350 kcal and 20 grams of protein. - will order Hormel Shake with breakfast meal, each supplement provides 500 kcal and 22 grams protein. - will order Magic Cup BID with meals, each supplement provides 290 kcal and 9 grams of protein - per MD approval, will liberalize diet from Heart Healthy to Regular.    NUTRITION DIAGNOSIS:   Inadequate oral intake related to acute illness, lethargy/confusion, decreased appetite as evidenced by per patient/family report, meal completion < 25%.  GOAL:   Patient will meet greater than or equal to 90% of their needs  MONITOR:   PO intake, Supplement acceptance, Labs, Weight trends  REASON FOR ASSESSMENT:   Consult Assessment of nutrition requirement/status, Malnutrition Eval  ASSESSMENT:   86 year old female with past medical history of dementia, HTN, and HLD. She presented from home with progressive weakness/functional decline and cognitive decline since at least Christmas time. She has had decreased oral intake and lack of interest in eating or drinking. She has also been ambulating much less than usual. CT head showed no acute intracranial abnormality. CXR with no acute cardiopulmonary disease process. Patient admitted for hypokalemia with progressive weakness, fatigue, and FTT.  Per flow sheet documentation, patient is a/o to self only and consumed 10% of breakfast, 5% of lunch, and 0% of dinner yesterday.   Patient was sleeping soundly at the time of RD visit and no family/visitors were present at that time. Did not attempt to awake patient or perform NFPE due to noted mentation. Lunch tray was in the room and was untouched.   Per chart review, current weight is 108 lb and no weight hx from PTA is available.   SLP is following patient and saw her earlier today; recommended regular, thin liquids  consistency diet.  Palliative Care is following and last saw patient this AM. Patient is DNR.   Per notes: - adult FTT - hx of advanced dementia - weakness/debility--OT recommending SNF placement   Labs reviewed; Cl: 97 mmol/l, Ca: 8 mg/dl. Medications reviewed; 40 mEq Klor-Con/day.  IVF; NS @ 75 ml/hr.    NUTRITION - FOCUSED PHYSICAL EXAM:  unable to complete at this time.   Diet Order:   Diet Order            Diet regular Room service appropriate? Yes; Fluid consistency: Thin  Diet effective now              EDUCATION NEEDS:   No education needs have been identified at this time  Skin:  Skin Assessment: Reviewed RN Assessment  Last BM:  1/10  Height:   Ht Readings from Last 1 Encounters:  02/16/19 5\' 4"  (1.626 m)    Weight:   Wt Readings from Last 1 Encounters:  02/16/19 49 kg    Ideal Body Weight:  54.5 kg  BMI:  Body mass index is 18.54 kg/m.  Estimated Nutritional Needs:   Kcal:  1470-1715 kcal  Protein:  70-80 grams  Fluid:  >/= 1.8 L/day      04/16/19, MS, RD, LDN, [facility] Inpatient Clinical Dietitian Pager # [phone] After hours/weekend pager # [phone]

## 2019-02-17 NOTE — Progress Notes (Signed)
PROGRESS NOTE    Patricia Carlson  DVV:[id number] DOB: 1925-12-26 DOA: 02/15/2019 PCP: Patricia Raider, MD    Brief Narrative:   Patricia Carlson is a 86 year old female with past medical history significant for dementia, HTN, HLD who presented from home with progressive weakness.  Family has noted progressive decline since Christmas 2020, both functionally and cognitively.  She is also had reduced oral intake, lack of interest in eating or drinking with decreased mobility.  Evaluation in the ED notable for potassium 2.8, sodium 142, creatinine 0.69, phosphorus 2.5.  CT head without acute intracranial abnormality.  Chest x-ray with no acute cardiopulmonary disease process.  KUB with nonobstructive bowel gas pattern.  Patient was referred for admission by ED physician for hypokalemia in the setting of progressive weakness, fatigue and likely failure to thrive.  Assessment & Plan:   Active Problems:   Dementia without behavioral disturbance (HCC)   Dehydration   Hypokalemia   Hypocalcemia   Hypokalemia Potassium 2.8 on admission, etiology likely secondary to poor oral intake. --K 2.8-->3.6 today --M 2.2 today --Continue replacement with KCl 40 mEq p.o. x 1 today --Continue to encourage increased oral intake, speech therapy liberalizing diet to regular --Repeat electrolytes in a.m. to include magnesium  Hypophosphatemia Phosphorus 2.2, will replete with 20 mmol potassium phosphate today --Repeat phosphate level in the a.m.  Adult failure to thrive History of advanced dementia Patient presenting from home, with history of advanced dementia; apparently significant decline cognitively and functionally since Christmas 2020.  Has been living home alone with family helping with cooking.  Was found to be significantly dehydrated, unlikely taking her home medications. --Palliative care consulted and following, appreciate assistance --continues with poor oral intake --Namenda 10 mg  p.o. twice daily --Haldol 0.5 mg p.o. every 6 hours as needed for agitation --PT/OT recommend SNF on dc, TOC team for coordination --Recommend outpatient palliative service to follow --Continue NS at 75 mL's per hour  Weakness, fatigue, debility --PT/OT recommend SNF placement. --Continue therapy efforts while inpatient --Plan discharge to SNF with palliative services to follow outpatient  Essential hypertension BP 129/69, well controlled. --Continue amlodipine 10 mg p.o. daily, metoprolol tartrate 12.5 mg p.o. twice daily  Hyperlipidemia: Atorvastatin 20 mg p.o. nightly  Severe protein calorie malnutrition Body mass index is 18.54 kg/m. Nutrition Status: Nutrition Problem: Inadequate oral intake Etiology: acute illness, lethargy/confusion, decreased appetite Signs/Symptoms: per patient/family report, meal completion < 25% Interventions: Ensure Enlive (each supplement provides 350kcal and 20 grams of protein), MVI, Hormel Shake, Magic cup, Liberalize Diet    DVT prophylaxis: Lovenox Code Status: DNR Family Communication: Updated patient's daughter, Patricia Carlson via telephone this afternoon Disposition Plan: Continue inpatient, will need SNF placement, transition of care team for coordination   Consultants:   Palliative care  Procedures:   None  Antimicrobials:   None   Subjective: Patient seen and examined at bedside, pleasantly confused.  No family present.  No specific complaints at this time.  Very thin, cachectic and dehydrated on appearance.  Denies shortness of breath, no chest pain, no abdominal pain.  Some concern this morning for agitation, although seems to have now resolved.  No other acute concerns overnight per nursing staff.  Objective: Vitals:   02/16/19 1954 02/17/19 0228 02/17/19 0640 02/17/19 1316  BP: 129/65 (!) 134/47 129/69 (!) 120/56  Pulse: 75 69 70 78  Resp: 16 16 20 16   Temp: (!) 97.4 F (36.3 C) (!) 97.3 F (36.3 C) (!) 97.2 F (36.2 C)  TempSrc: Oral Oral Oral   SpO2: 92% 95% 96% 98%  Weight:      Height:        Intake/Output Summary (Last 24 hours) at 02/17/2019 1334 Last data filed at 02/17/2019 0500 Gross per 24 hour  Intake 698.78 ml  Output 150 ml  Net 548.78 ml   Filed Weights   02/16/19 0034  Weight: 49 kg    Examination:  General exam: Appears calm and comfortable, lying in bed, thin/cachectic in appearance Respiratory system: Clear to auscultation. Respiratory effort normal.  Oxygenating well on room air Cardiovascular system: S1 & S2 heard, RRR. No JVD, murmurs, rubs, gallops or clicks. No pedal edema. Gastrointestinal system: Abdomen is nondistended, soft and nontender. No organomegaly or masses felt. Normal bowel sounds heard. Central nervous system: Alert, not oriented to person/place/time. No focal neurological deficits. Extremities: Symmetric 5 x 5 power. Skin: No rashes, lesions or ulcers Psychiatry: Judgement and insight appear poor.  Depressed mood & flat affect.     Data Reviewed: I have personally reviewed following labs and imaging studies  CBC: Recent Labs  Lab 02/15/19 1547 02/16/19 0504 02/17/19 0504  WBC 7.3 9.2 7.0  NEUTROABS 5.9  --   --   HGB 14.2 12.7 13.9  HCT 44.5 39.5 41.9  MCV 102.8* 102.6* 101.2*  PLT 155 227 540   Basic Metabolic Panel: Recent Labs  Lab 02/15/19 1801 02/15/19 2234 02/16/19 0504 02/17/19 0504  NA 142  --  141 137  K 2.8*  --  2.8* 3.6  CL 98  --  98 97*  CO2 31  --  32 27  GLUCOSE 105*  --  101* 102*  BUN 19  --  17 15  CREATININE 0.69  --  0.74 0.52  CALCIUM 7.6*  --  7.8* 8.0*  MG  --  2.3 2.2 2.2  PHOS  --  2.5 2.4* 2.4*   GFR: Estimated Creatinine Clearance: 35.4 mL/min (by C-G formula based on SCr of 0.52 mg/dL). Liver Function Tests: Recent Labs  Lab 02/15/19 1801 02/16/19 0504 02/17/19 0504  AST 19 19 13*  ALT 10 13 11   ALKPHOS 56 67 67  BILITOT 1.5* 0.9 0.9  PROT 5.5* 6.0* 6.0*  ALBUMIN 2.9* 3.1* 3.2*   No  results for input(s): LIPASE, AMYLASE in the last 168 hours. No results for input(s): AMMONIA in the last 168 hours. Coagulation Profile: No results for input(s): INR, PROTIME in the last 168 hours. Cardiac Enzymes: Recent Labs  Lab 02/15/19 2234  CKTOTAL 40   BNP (last 3 results) No results for input(s): PROBNP in the last 8760 hours. HbA1C: No results for input(s): HGBA1C in the last 72 hours. CBG: No results for input(s): GLUCAP in the last 168 hours. Lipid Profile: No results for input(s): CHOL, HDL, LDLCALC, TRIG, CHOLHDL, LDLDIRECT in the last 72 hours. Thyroid Function Tests: Recent Labs    02/15/19 2234 02/16/19 0504  TSH 3.142 3.391  T4TOTAL 5.6  --    Anemia Panel: No results for input(s): VITAMINB12, FOLATE, FERRITIN, TIBC, IRON, RETICCTPCT in the last 72 hours. Sepsis Labs: Recent Labs  Lab 02/15/19 1547  LATICACIDVEN 1.8    Recent Results (from the past 240 hour(s))  Respiratory Panel by RT PCR (Flu A&B, Covid) - Nasopharyngeal Swab     Status: None   Collection Time: 02/15/19  3:47 PM   Specimen: Nasopharyngeal Swab  Result Value Ref Range Status   SARS Coronavirus 2 by RT PCR NEGATIVE NEGATIVE Final  Comment: (NOTE) SARS-CoV-2 target nucleic acids are NOT DETECTED. The SARS-CoV-2 RNA is generally detectable in upper respiratoy specimens during the acute phase of infection. The lowest concentration of SARS-CoV-2 viral copies this assay can detect is 131 copies/mL. A negative result does not preclude SARS-Cov-2 infection and should not be used as the sole basis for treatment or other patient management decisions. A negative result may occur with  improper specimen collection/handling, submission of specimen other than nasopharyngeal swab, presence of viral mutation(s) within the areas targeted by this assay, and inadequate number of viral copies (<131 copies/mL). A negative result must be combined with clinical observations, patient history, and  epidemiological information. The expected result is Negative. Fact Sheet for Patients:  https://www.moore.com/ Fact Sheet for Healthcare Providers:  https://www.young.biz/ This test is not yet ap proved or cleared by the [place] FDA and  has been authorized for detection and/or diagnosis of SARS-CoV-2 by FDA under an Emergency Use Authorization (EUA). This EUA will remain  in effect (meaning this test can be used) for the duration of the COVID-19 declaration under Section 564(b)(1) of the Act, 21 U.S.C. section 360bbb-3(b)(1), unless the authorization is terminated or revoked sooner.    Influenza A by PCR NEGATIVE NEGATIVE Final   Influenza B by PCR NEGATIVE NEGATIVE Final    Comment: (NOTE) The Xpert Xpress SARS-CoV-2/FLU/RSV assay is intended as an aid in  the diagnosis of influenza from Nasopharyngeal swab specimens and  should not be used as a sole basis for treatment. Nasal washings and  aspirates are unacceptable for Xpert Xpress SARS-CoV-2/FLU/RSV  testing. Fact Sheet for Patients: https://www.moore.com/ Fact Sheet for Healthcare Providers: https://www.young.biz/ This test is not yet approved or cleared by the [place] FDA and  has been authorized for detection and/or diagnosis of SARS-CoV-2 by  FDA under an Emergency Use Authorization (EUA). This EUA will remain  in effect (meaning this test can be used) for the duration of the  Covid-19 declaration under Section 564(b)(1) of the Act, 21  U.S.C. section 360bbb-3(b)(1), unless the authorization is  terminated or revoked. Performed at [facility], [address] [place], [place] [address]   Urine culture     Status: Abnormal   Collection Time: 02/16/19  8:15 AM   Specimen: Urine, Random  Result Value Ref Range Status   Specimen Description   Final    URINE, RANDOM Performed at [facility], [address] [place] [address], [place], [place] [address]    Special Requests   Final    NONE Performed at [facility], [address], [place], [place] [address]    Culture MULTIPLE SPECIES PRESENT, SUGGEST RECOLLECTION (A)  Final   Report Status 02/17/2019 FINAL  Final         Radiology Studies: DG Abd 1 View  Result Date: 02/15/2019 CLINICAL DATA:  Diarrhea EXAM: ABDOMEN - 1 VIEW COMPARISON:  None. FINDINGS: The bowel gas pattern is normal. No radio-opaque calculi or other significant radiographic abnormality are seen. There is a dextroconvex scoliotic curvature of the lumbar spine. IMPRESSION: Nonobstructive bowel gas pattern. Electronically Signed   By: Jonna Clark M.D.   On: 02/15/2019 22:55   CT Head Wo Contrast  Result Date: 02/15/2019 CLINICAL DATA:  Altered mental status EXAM: CT HEAD WITHOUT CONTRAST TECHNIQUE: Contiguous axial images were obtained from the base of the skull through the vertex without intravenous contrast. COMPARISON:  None. FINDINGS: Brain: No evidence of acute infarction, hemorrhage, hydrocephalus, extra-axial collection or mass  lesion/mass effect. There is mild cerebral volume loss with associated ex vacuo dilatation. Periventricular white matter hypoattenuation likely represents chronic small vessel ischemic disease. Vascular: There are vascular calcifications in the carotid siphons. Skull: Normal. Negative for fracture or focal lesion. Sinuses/Orbits: No acute finding. Other: None. IMPRESSION: 1. No acute intracranial process. Electronically Signed   By: Romona Curls M.D.   On: 02/15/2019 16:21   DG Chest Port 1 View  Result Date: 02/15/2019 CLINICAL DATA:  Weakness EXAM: PORTABLE CHEST 1 VIEW COMPARISON:  CT chest dated 02/20/2005 and chest radiograph dated 02/01/2005. FINDINGS: The heart size and mediastinal contours are within normal limits. Both lungs are clear. The visualized skeletal structures are unremarkable. IMPRESSION: No active disease.  Electronically Signed   By: Romona Curls M.D.   On: 02/15/2019 16:17        Scheduled Meds: . amLODipine  10 mg Oral Daily  . atorvastatin  20 mg Oral QHS  . enoxaparin (LOVENOX) injection  30 mg Subcutaneous Q24H  . feeding supplement (ENSURE ENLIVE)  237 mL Oral BID BM  . memantine  10 mg Oral BID  . metoprolol tartrate  12.5 mg Oral BID   Continuous Infusions: . sodium chloride 75 mL/hr at 02/16/19 0940     LOS: 1 day    Time spent: 36 minutes spent on chart review, discussion with nursing staff, consultants, updating family and interview/physical exam; more than 50% of that time was spent in counseling and/or coordination of care.    Patricia Philips [place], DO [facility] 02/17/2019, 1:34 PM

## 2019-02-18 LAB — CBC
HCT: 34.9 % — ABNORMAL LOW (ref 36.0–46.0)
Hemoglobin: 11.4 g/dL — ABNORMAL LOW (ref 12.0–15.0)
MCH: 32.6 pg (ref 26.0–34.0)
MCHC: 32.7 g/dL (ref 30.0–36.0)
MCV: 99.7 fL (ref 80.0–100.0)
Platelets: 206 10*3/uL (ref 150–400)
RBC: 3.5 MIL/uL — ABNORMAL LOW (ref 3.87–5.11)
RDW: 14.9 % (ref 11.5–15.5)
WBC: 5.7 10*3/uL (ref 4.0–10.5)
nRBC: 0 % (ref 0.0–0.2)

## 2019-02-18 LAB — MAGNESIUM: Magnesium: 1.9 mg/dL (ref 1.7–2.4)

## 2019-02-18 LAB — BASIC METABOLIC PANEL
Anion gap: 8 (ref 5–15)
BUN: 13 mg/dL (ref 8–23)
CO2: 27 mmol/L (ref 22–32)
Calcium: 8.1 mg/dL — ABNORMAL LOW (ref 8.9–10.3)
Chloride: 102 mmol/L (ref 98–111)
Creatinine, Ser: 0.65 mg/dL (ref 0.44–1.00)
GFR calc Af Amer: >60 mL/min (ref >60)
GFR calc non Af Amer: >60 mL/min (ref >60)
Glucose, Bld: 103 mg/dL — ABNORMAL HIGH (ref 70–99)
Potassium: 4.3 mmol/L (ref 3.5–5.1)
Sodium: 137 mmol/L (ref 135–145)

## 2019-02-18 LAB — PHOSPHORUS: Phosphorus: 3.9 mg/dL (ref 2.5–4.6)

## 2019-02-18 NOTE — Progress Notes (Signed)
Patient has been attempting to get out of bed. Haldol 0.5 mg PO given with PM medications. Patient was repositioned in bed. Bed in lowest position with bed alarm set. Patient was given 2 spoonfuls of vanilla pudding with pills but declined any additional intake. Patient was given two swallows of chocolate Ensure but declined any additional intake. Patient was given one swallow of water but declined any additional intake. Call bell within reach. Will continue to monitor.

## 2019-02-18 NOTE — Progress Notes (Signed)
PROGRESS NOTE    [name]  CWC:[id number] DOB: [birth year] DOA: 02/15/2019 PCP: Lupita Raider, MD    Brief Narrative:   Patricia Carlson is a 86 year old female with past medical history significant for dementia, HTN, HLD who presented from home with progressive weakness.  Family has noted progressive decline since Christmas 2020, both functionally and cognitively.  She is also had reduced oral intake, lack of interest in eating or drinking with decreased mobility.  Evaluation in the ED notable for potassium 2.8, sodium 142, creatinine 0.69, phosphorus 2.5.  CT head without acute intracranial abnormality.  Chest x-ray with no acute cardiopulmonary disease process.  KUB with nonobstructive bowel gas pattern.  Patient was referred for admission by ED physician for hypokalemia in the setting of progressive weakness, fatigue and likely failure to thrive.  Assessment & Plan:   Principal Problem:   Hypokalemia Active Problems:   Dementia without behavioral disturbance (HCC)   Dehydration   Adult failure to thrive   Severe protein-calorie malnutrition (HCC)   Hypophosphatemia   Palliative care encounter   Hypokalemia Potassium 2.8 on admission, etiology likely secondary to poor oral intake. --K 2.8-->3.6-->4.3 today --M 1.9 today --Continue to encourage increased oral intake, speech therapy liberalized diet to regular --Repeat electrolytes in a.m. to include magnesium  Hypophosphatemia Repleted with 20 mmol potassium phosphate on 02/17/2019. --Phosphorus 3.9 today. --Repeat phosphate level in the a.m.  Adult failure to thrive History of advanced dementia Patient presenting from home, with history of advanced dementia; apparently significant decline cognitively and functionally since Christmas 2020.  Has been living home alone with family helping with cooking.  Was found to be significantly dehydrated, unlikely taking her home medications. --Palliative care consulted  and following, appreciate assistance --continues with poor oral intake --Namenda 10 mg p.o. twice daily --Haldol 0.5 mg p.o. every 6 hours as needed for agitation --PT/OT recommend SNF on dc, TOC team for coordination --Recommend outpatient palliative service to follow --Continue NS at 75 mL's per hour  Weakness, fatigue, debility --PT/OT recommend SNF placement. --Continue therapy efforts while inpatient --Plan discharge to SNF with palliative services to follow outpatient  Essential hypertension BP 129/69, well controlled. --Continue amlodipine 10 mg p.o. daily, metoprolol tartrate 12.5 mg p.o. twice daily  Hyperlipidemia: Atorvastatin 20 mg p.o. nightly  Severe protein calorie malnutrition Body mass index is 18.54 kg/m. Nutrition Status: Nutrition Problem: Inadequate oral intake Etiology: acute illness, lethargy/confusion, decreased appetite Signs/Symptoms: per patient/family report, meal completion < 25% Interventions: Ensure Enlive (each supplement provides 350kcal and 20 grams of protein), MVI, Hormel Shake, Magic cup, Liberalize Diet    DVT prophylaxis: Lovenox Code Status: DNR Family Communication: Attempted to update patient's daughter, [name] via telephone, unsuccessful, left voicemail Disposition Plan: Continue inpatient, will need SNF placement, transition of care team for coordination   Consultants:   Palliative care  Procedures:   None  Antimicrobials:   None   Subjective: Patient seen and examined at bedside, pleasantly confused.  No family present.  No specific complaints at this time.  Very thin, cachectic. Denies shortness of breath, no chest pain, no abdominal pain.  No acute concerns overnight per nursing staff.  Objective: Vitals:   02/17/19 1316 02/17/19 2101 02/18/19 0500 02/18/19 0646  BP: (!) 120/56 (!) 108/56 (!) 113/100 117/66  Pulse: 78 66 68 69  Resp: 16 20 16    Temp:  98 F (36.7 C) 98.2 F (36.8 C)   TempSrc:  Oral Oral     SpO2: 98% 96% 92%  Weight:      Height:        Intake/Output Summary (Last 24 hours) at 02/18/2019 1314 Last data filed at 02/18/2019 1000 Gross per 24 hour  Intake 568.48 ml  Output --  Net 568.48 ml   Filed Weights   02/16/19 0034  Weight: 49 kg    Examination:  General exam: Appears calm and comfortable, lying in bed, thin/cachectic in appearance Respiratory system: Clear to auscultation. Respiratory effort normal.  Oxygenating well on room air Cardiovascular system: S1 & S2 heard, RRR. No JVD, murmurs, rubs, gallops or clicks. No pedal edema. Gastrointestinal system: Abdomen is nondistended, soft and nontender. No organomegaly or masses felt. Normal bowel sounds heard. Central nervous system: Alert, not oriented to person/place/time. No focal neurological deficits. Extremities: Symmetric 5 x 5 power. Skin: No rashes, lesions or ulcers Psychiatry: Judgement and insight appear poor.  Depressed mood & flat affect.     Data Reviewed: I have personally reviewed following labs and imaging studies  CBC: Recent Labs  Lab 02/15/19 1547 02/16/19 0504 02/17/19 0504 02/18/19 0445  WBC 7.3 9.2 7.0 5.7  NEUTROABS 5.9  --   --   --   HGB 14.2 12.7 13.9 11.4*  HCT 44.5 39.5 41.9 34.9*  MCV 102.8* 102.6* 101.2* 99.7  PLT 155 227 233 206   Basic Metabolic Panel: Recent Labs  Lab 02/15/19 1801 02/15/19 2234 02/16/19 0504 02/17/19 0504 02/18/19 0445  NA 142  --  141 137 137  K 2.8*  --  2.8* 3.6 4.3  CL 98  --  98 97* 102  CO2 31  --  32 27 27  GLUCOSE 105*  --  101* 102* 103*  BUN 19  --  17 15 13   CREATININE 0.69  --  0.74 0.52 0.65  CALCIUM 7.6*  --  7.8* 8.0* 8.1*  MG  --  2.3 2.2 2.2 1.9  PHOS  --  2.5 2.4* 2.4* 3.9   GFR: Estimated Creatinine Clearance: 35.4 mL/min (by C-G formula based on SCr of 0.65 mg/dL). Liver Function Tests: Recent Labs  Lab 02/15/19 1801 02/16/19 0504 02/17/19 0504  AST 19 19 13*  ALT 10 13 11   ALKPHOS 56 67 67  BILITOT 1.5*  0.9 0.9  PROT 5.5* 6.0* 6.0*  ALBUMIN 2.9* 3.1* 3.2*   No results for input(s): LIPASE, AMYLASE in the last 168 hours. No results for input(s): AMMONIA in the last 168 hours. Coagulation Profile: No results for input(s): INR, PROTIME in the last 168 hours. Cardiac Enzymes: Recent Labs  Lab 02/15/19 2234  CKTOTAL 40   BNP (last 3 results) No results for input(s): PROBNP in the last 8760 hours. HbA1C: No results for input(s): HGBA1C in the last 72 hours. CBG: No results for input(s): GLUCAP in the last 168 hours. Lipid Profile: No results for input(s): CHOL, HDL, LDLCALC, TRIG, CHOLHDL, LDLDIRECT in the last 72 hours. Thyroid Function Tests: Recent Labs    02/15/19 2234 02/16/19 0504  TSH 3.142 3.391  T4TOTAL 5.6  --    Anemia Panel: No results for input(s): VITAMINB12, FOLATE, FERRITIN, TIBC, IRON, RETICCTPCT in the last 72 hours. Sepsis Labs: Recent Labs  Lab 02/15/19 1547  LATICACIDVEN 1.8    Recent Results (from the past 240 hour(s))  Respiratory Panel by RT PCR (Flu A&B, Covid) - Nasopharyngeal Swab     Status: None   Collection Time: 02/15/19  3:47 PM   Specimen: Nasopharyngeal Swab  Result Value Ref Range Status  SARS Coronavirus 2 by RT PCR NEGATIVE NEGATIVE Final    Comment: (NOTE) SARS-CoV-2 target nucleic acids are NOT DETECTED. The SARS-CoV-2 RNA is generally detectable in upper respiratoy specimens during the acute phase of infection. The lowest concentration of SARS-CoV-2 viral copies this assay can detect is 131 copies/mL. A negative result does not preclude SARS-Cov-2 infection and should not be used as the sole basis for treatment or other patient management decisions. A negative result may occur with  improper specimen collection/handling, submission of specimen other than nasopharyngeal swab, presence of viral mutation(s) within the areas targeted by this assay, and inadequate number of viral copies (<131 copies/mL). A negative result must be  combined with clinical observations, patient history, and epidemiological information. The expected result is Negative. Fact Sheet for Patients:  PinkCheek.be Fact Sheet for Healthcare Providers:  GravelBags.it This test is not yet ap proved or cleared by the [place] FDA and  has been authorized for detection and/or diagnosis of SARS-CoV-2 by FDA under an Emergency Use Authorization (EUA). This EUA will remain  in effect (meaning this test can be used) for the duration of the COVID-19 declaration under Section 564(b)(1) of the Act, 21 U.S.C. section 360bbb-3(b)(1), unless the authorization is terminated or revoked sooner.    Influenza A by PCR NEGATIVE NEGATIVE Final   Influenza B by PCR NEGATIVE NEGATIVE Final    Comment: (NOTE) The Xpert Xpress SARS-CoV-2/FLU/RSV assay is intended as an aid in  the diagnosis of influenza from Nasopharyngeal swab specimens and  should not be used as a sole basis for treatment. Nasal washings and  aspirates are unacceptable for Xpert Xpress SARS-CoV-2/FLU/RSV  testing. Fact Sheet for Patients: PinkCheek.be Fact Sheet for Healthcare Providers: GravelBags.it This test is not yet approved or cleared by the [place] FDA and  has been authorized for detection and/or diagnosis of SARS-CoV-2 by  FDA under an Emergency Use Authorization (EUA). This EUA will remain  in effect (meaning this test can be used) for the duration of the  Covid-19 declaration under Section 564(b)(1) of the Act, 21  U.S.C. section 360bbb-3(b)(1), unless the authorization is  terminated or revoked. Performed at [facility], [place] [address] [place], [place] [address]   Urine culture     Status: Abnormal   Collection Time: 02/16/19  8:15 AM   Specimen: Urine, Random  Result Value Ref Range Status   Specimen Description   Final     URINE, RANDOM Performed at [place] [address], [place], [place] [address]    Special Requests   Final    NONE Performed at [facility], [place] [address] [place], [place] [address]    Culture MULTIPLE SPECIES PRESENT, SUGGEST RECOLLECTION (A)  Final   Report Status 02/17/2019 FINAL  Final         Radiology Studies: No results found.      Scheduled Meds: . amLODipine  10 mg Oral Daily  . atorvastatin  20 mg Oral QHS  . enoxaparin (LOVENOX) injection  30 mg Subcutaneous Q24H  . feeding supplement (ENSURE ENLIVE)  237 mL Oral BID BM  . memantine  10 mg Oral BID  . metoprolol tartrate  12.5 mg Oral BID   Continuous Infusions: . sodium chloride 75 mL/hr at 02/18/19 0226     LOS: 2 days    Time spent: 36 minutes spent on chart review, discussion with nursing staff, consultants, updating family and interview/physical exam; more than 50% of that  time was spent in counseling and/or coordination of care.    Alvira Philips [place], DO [facility] 02/18/2019, 1:14 PM

## 2019-02-18 NOTE — Progress Notes (Signed)
Physical Therapy Treatment Patient Details Name: Patricia Carlson MRN: [id number] DOB: [birth year] Today's Date: 02/18/2019    History of Present Illness Pt admitted 2* increasing weakness and inability to self-care.  Pt with hx of dementia    PT Comments    Pt assisted to sitting EOB however fatigued quickly and requested return to supine.  Pt repositioned to comfort.  Continue to recommend SNF upon d/c.  Follow Up Recommendations  SNF     Equipment Recommendations  None recommended by PT    Recommendations for Other Services       Precautions / Restrictions Precautions Precautions: Fall    Mobility  Bed Mobility Overal bed mobility: Needs Assistance Bed Mobility: Supine to Sit;Sit to Supine     Supine to sit: Max assist Sit to supine: Max assist   General bed mobility comments: pt requiring more assist for bed mobility today, pt required assist for upper and lower body, pt attempting to assist with bring LEs to EOB and using bed rail  Transfers                 General transfer comment: pt felt unable to perform further mobility and requested return to supine (fatigued quickly, reports generalized weakness)  Ambulation/Gait                 Stairs             Wheelchair Mobility    Modified Rankin (Stroke Patients Only)       Balance Overall balance assessment: Needs assistance Sitting-balance support: Single extremity supported;Feet supported Sitting balance-Leahy Scale: Poor Sitting balance - Comments: requiring min assist for trunk support                                    Cognition Arousal/Alertness: Awake/alert Behavior During Therapy: Flat affect Overall Cognitive Status: History of cognitive impairments - at baseline                                 General Comments: pt with history of dementia at baseline;pt appeared [facility] for simple commands      Exercises      General Comments         Pertinent Vitals/Pain Pain Assessment: No/denies pain    Home Living                      Prior Function            PT Goals (current goals can now be found in the care plan section) Progress towards PT goals: Progressing toward goals    Frequency    Min 2X/week      PT Plan Current plan remains appropriate    Co-evaluation              AM-PAC PT "6 Clicks" Mobility   Outcome Measure  Help needed turning from your back to your side while in a flat bed without using bedrails?: A Little Help needed moving from lying on your back to sitting on the side of a flat bed without using bedrails?: A Lot Help needed moving to and from a bed to a chair (including a wheelchair)?: A Lot Help needed standing up from a chair using your arms (e.g., wheelchair or bedside chair)?: A Lot Help needed to walk in hospital room?: A  Lot Help needed climbing 3-5 steps with a railing? : A Lot 6 Click Score: 13    End of Session   Activity Tolerance: Patient limited by fatigue Patient left: with call bell/phone within reach;in bed;with bed alarm set Nurse Communication: Mobility status PT Visit Diagnosis: Difficulty in walking, not elsewhere classified (R26.2);Muscle weakness (generalized) (M62.81)     Time: 9323-5573 PT Time Calculation (min) (ACUTE ONLY): 11 min  Charges:  $Therapeutic Activity: 8-22 mins                    Arlyce Dice, DPT Acute Rehabilitation Services Office: [phone]  Trena Platt 02/18/2019, 4:25 PM

## 2019-02-19 LAB — BASIC METABOLIC PANEL
Anion gap: 7 (ref 5–15)
BUN: 12 mg/dL (ref 8–23)
CO2: 25 mmol/L (ref 22–32)
Calcium: 8.1 mg/dL — ABNORMAL LOW (ref 8.9–10.3)
Chloride: 103 mmol/L (ref 98–111)
Creatinine, Ser: 0.59 mg/dL (ref 0.44–1.00)
GFR calc Af Amer: >60 mL/min (ref >60)
GFR calc non Af Amer: >60 mL/min (ref >60)
Glucose, Bld: 116 mg/dL — ABNORMAL HIGH (ref 70–99)
Potassium: 3.9 mmol/L (ref 3.5–5.1)
Sodium: 135 mmol/L (ref 135–145)

## 2019-02-19 LAB — CBC
HCT: 35.7 % — ABNORMAL LOW (ref 36.0–46.0)
Hemoglobin: 12 g/dL (ref 12.0–15.0)
MCH: 33.5 pg (ref 26.0–34.0)
MCHC: 33.6 g/dL (ref 30.0–36.0)
MCV: 99.7 fL (ref 80.0–100.0)
Platelets: 212 10*3/uL (ref 150–400)
RBC: 3.58 MIL/uL — ABNORMAL LOW (ref 3.87–5.11)
RDW: 14.9 % (ref 11.5–15.5)
WBC: 8.2 10*3/uL (ref 4.0–10.5)
nRBC: 0 % (ref 0.0–0.2)

## 2019-02-19 LAB — PHOSPHORUS: Phosphorus: 3.2 mg/dL (ref 2.5–4.6)

## 2019-02-19 LAB — SARS CORONAVIRUS 2 (TAT 6-24 HRS): SARS Coronavirus 2: NEGATIVE

## 2019-02-19 LAB — MAGNESIUM: Magnesium: 1.9 mg/dL (ref 1.7–2.4)

## 2019-02-19 NOTE — Progress Notes (Signed)
PROGRESS NOTE    Patricia Carlson  IRW:[id number] DOB: 1926-01-03 DOA: 02/15/2019 PCP: Lupita Raider, MD    Brief Narrative:  86 year old female with past medical history significant for dementia, HTN, HLD who presented from home with progressive weakness.  Family has noted progressive decline since Christmas 2020, both functionally and cognitively.  She is also had reduced oral intake, lack of interest in eating or drinking with decreased mobility.  Evaluation in the ED notable for potassium 2.8, sodium 142, creatinine 0.69, phosphorus 2.5.  CT head without acute intracranial abnormality.  Chest x-ray with no acute cardiopulmonary disease process.  KUB with nonobstructive bowel gas pattern.  Patient was referred for admission by ED physician for hypokalemia in the setting of progressive weakness, fatigue and likely failure to thrive.  Assessment & Plan:   Principal Problem:   Hypokalemia Active Problems:   Dementia without behavioral disturbance (HCC)   Dehydration   Adult failure to thrive   Severe protein-calorie malnutrition (HCC)   Hypophosphatemia   Palliative care encounter  Hypokalemia -Potassium 2.8 on admission, etiology likely secondary to poor oral intake. -Replaced -Repeat bmet in AM  Hypophosphatemia -Repleted with 20 mmol potassium phosphate on 02/17/2019. --Phos of 3.2  -Repeat lytes in AM  Adult failure to thrive History of advanced dementia Patient presenting from home, with history of advanced dementia; apparently significant decline cognitively and functionally since Christmas 2020.  Has been living home alone with family helping with cooking.  Was found to be significantly dehydrated, unlikely taking her home medications. --Palliative care consulted and following, appreciate assistance --continues with poor oral intake --Namenda 10 mg p.o. twice daily --Haldol 0.5 mg p.o. every 6 hours as needed for agitation --PT/OT recommend SNF on dc, TOC team  for coordination --Recommend outpatient palliative service to follow --Continue NS at 75 mL's per hour -Stable at this time  Weakness, fatigue, debility --PT/OT recommend SNF placement. --Continue therapy efforts while inpatient --Plan discharge to SNF with palliative services to follow outpatient -Stable currently  Essential hypertension BP 129/69, well controlled. --Continue amlodipine 10 mg p.o. daily, metoprolol tartrate 12.5 mg p.o. twice daily  Hyperlipidemia: Atorvastatin 20 mg p.o. nightly -Stable  Severe protein calorie malnutrition Body mass index is 18.54 kg/m. Nutrition Status: Nutrition Problem: Inadequate oral intake Etiology: acute illness, lethargy/confusion, decreased appetite Signs/Symptoms: per patient/family report, meal completion < 25% Interventions: Ensure Enlive (each supplement provides 350kcal and 20 grams of protein), MVI, Hormel Shake, Magic cup, Liberalize Diet -Cont to encourage po as tolerated  DVT prophylaxis: Lovenox subQ Code Status: DNR Family Communication: Pt in room, family not at bedside Disposition Plan: Uncertain at this time  Consultants:   Palliative Care  Procedures:     Antimicrobials: Anti-infectives (From admission, onward)   None       Subjective: Without complaints this AM  Objective: Vitals:   02/18/19 1339 02/18/19 2041 02/19/19 0356 02/19/19 1437  BP: 112/61 113/61 115/60 (!) 103/52  Pulse: 65 79 66 68  Resp: 20 18 18 16   Temp: 97.6 F (36.4 C) 98 F (36.7 C) 97.7 F (36.5 C)   TempSrc: Oral     SpO2: 93% 98% 95%   Weight:      Height:        Intake/Output Summary (Last 24 hours) at 02/19/2019 1653 Last data filed at 02/19/2019 1200 Gross per 24 hour  Intake 1400.91 ml  Output --  Net 1400.91 ml   Filed Weights   02/16/19 0034  Weight: 49 kg  Examination:  General exam: Appears calm and comfortable  Respiratory system: Clear to auscultation. Respiratory effort  normal. Cardiovascular system: S1 & S2 heard, Regular Gastrointestinal system: Abdomen is nondistended, soft and nontender. No organomegaly or masses felt. Normal bowel sounds heard. Central nervous system: Alert and oriented. No focal neurological deficits. Extremities: Symmetric 5 x 5 power. Skin: No rashes, lesions  Psychiatry: Judgement and insight appear normal. Mood & affect appropriate.   Data Reviewed: I have personally reviewed following labs and imaging studies  CBC: Recent Labs  Lab 02/15/19 1547 02/16/19 0504 02/17/19 0504 02/18/19 0445 02/19/19 0531  WBC 7.3 9.2 7.0 5.7 8.2  NEUTROABS 5.9  --   --   --   --   HGB 14.2 12.7 13.9 11.4* 12.0  HCT 44.5 39.5 41.9 34.9* 35.7*  MCV 102.8* 102.6* 101.2* 99.7 99.7  PLT 155 227 233 206 884   Basic Metabolic Panel: Recent Labs  Lab 02/15/19 1801 02/15/19 2234 02/16/19 0504 02/17/19 0504 02/18/19 0445 02/19/19 0531  NA 142  --  141 137 137 135  K 2.8*  --  2.8* 3.6 4.3 3.9  CL 98  --  98 97* 102 103  CO2 31  --  32 27 27 25   GLUCOSE 105*  --  101* 102* 103* 116*  BUN 19  --  17 15 13 12   CREATININE 0.69  --  0.74 0.52 0.65 0.59  CALCIUM 7.6*  --  7.8* 8.0* 8.1* 8.1*  MG  --  2.3 2.2 2.2 1.9 1.9  PHOS  --  2.5 2.4* 2.4* 3.9 3.2   GFR: Estimated Creatinine Clearance: 35.4 mL/min (by C-G formula based on SCr of 0.59 mg/dL). Liver Function Tests: Recent Labs  Lab 02/15/19 1801 02/16/19 0504 02/17/19 0504  AST 19 19 13*  ALT 10 13 11   ALKPHOS 56 67 67  BILITOT 1.5* 0.9 0.9  PROT 5.5* 6.0* 6.0*  ALBUMIN 2.9* 3.1* 3.2*   No results for input(s): LIPASE, AMYLASE in the last 168 hours. No results for input(s): AMMONIA in the last 168 hours. Coagulation Profile: No results for input(s): INR, PROTIME in the last 168 hours. Cardiac Enzymes: Recent Labs  Lab 02/15/19 2234  CKTOTAL 40   BNP (last 3 results) No results for input(s): PROBNP in the last 8760 hours. HbA1C: No results for input(s): HGBA1C in the  last 72 hours. CBG: No results for input(s): GLUCAP in the last 168 hours. Lipid Profile: No results for input(s): CHOL, HDL, LDLCALC, TRIG, CHOLHDL, LDLDIRECT in the last 72 hours. Thyroid Function Tests: No results for input(s): TSH, T4TOTAL, FREET4, T3FREE, THYROIDAB in the last 72 hours. Anemia Panel: No results for input(s): VITAMINB12, FOLATE, FERRITIN, TIBC, IRON, RETICCTPCT in the last 72 hours. Sepsis Labs: Recent Labs  Lab 02/15/19 1547  LATICACIDVEN 1.8    Recent Results (from the past 240 hour(s))  Respiratory Panel by RT PCR (Flu A&B, Covid) - Nasopharyngeal Swab     Status: None   Collection Time: 02/15/19  3:47 PM   Specimen: Nasopharyngeal Swab  Result Value Ref Range Status   SARS Coronavirus 2 by RT PCR NEGATIVE NEGATIVE Final    Comment: (NOTE) SARS-CoV-2 target nucleic acids are NOT DETECTED. The SARS-CoV-2 RNA is generally detectable in upper respiratoy specimens during the acute phase of infection. The lowest concentration of SARS-CoV-2 viral copies this assay can detect is 131 copies/mL. A negative result does not preclude SARS-Cov-2 infection and should not be used as the sole basis for treatment  or other patient management decisions. A negative result may occur with  improper specimen collection/handling, submission of specimen other than nasopharyngeal swab, presence of viral mutation(s) within the areas targeted by this assay, and inadequate number of viral copies (<131 copies/mL). A negative result must be combined with clinical observations, patient history, and epidemiological information. The expected result is Negative. Fact Sheet for Patients:  https://www.moore.com/ Fact Sheet for Healthcare Providers:  https://www.young.biz/ This test is not yet ap proved or cleared by the [place] FDA and  has been authorized for detection and/or diagnosis of SARS-CoV-2 by FDA under an Emergency Use Authorization  (EUA). This EUA will remain  in effect (meaning this test can be used) for the duration of the COVID-19 declaration under Section 564(b)(1) of the Act, 21 U.S.C. section 360bbb-3(b)(1), unless the authorization is terminated or revoked sooner.    Influenza A by PCR NEGATIVE NEGATIVE Final   Influenza B by PCR NEGATIVE NEGATIVE Final    Comment: (NOTE) The Xpert Xpress SARS-CoV-2/FLU/RSV assay is intended as an aid in  the diagnosis of influenza from Nasopharyngeal swab specimens and  should not be used as a sole basis for treatment. Nasal washings and  aspirates are unacceptable for Xpert Xpress SARS-CoV-2/FLU/RSV  testing. Fact Sheet for Patients: https://www.moore.com/ Fact Sheet for Healthcare Providers: https://www.young.biz/ This test is not yet approved or cleared by the [place] FDA and  has been authorized for detection and/or diagnosis of SARS-CoV-2 by  FDA under an Emergency Use Authorization (EUA). This EUA will remain  in effect (meaning this test can be used) for the duration of the  Covid-19 declaration under Section 564(b)(1) of the Act, 21  U.S.C. section 360bbb-3(b)(1), unless the authorization is  terminated or revoked. Performed at [facility], [address] [place], [place] [address]   Urine culture     Status: Abnormal   Collection Time: 02/16/19  8:15 AM   Specimen: Urine, Random  Result Value Ref Range Status   Specimen Description   Final    URINE, RANDOM Performed at [facility], [address], [place], [place] [address]    Special Requests   Final    NONE Performed at [facility], [address], [place], [place] [address]    Culture MULTIPLE SPECIES PRESENT, SUGGEST RECOLLECTION (A)  Final   Report Status 02/17/2019 FINAL  Final     Radiology Studies: No results found.  Scheduled Meds: . amLODipine  10 mg Oral Daily  . atorvastatin  20  mg Oral QHS  . enoxaparin (LOVENOX) injection  30 mg Subcutaneous Q24H  . feeding supplement (ENSURE ENLIVE)  237 mL Oral BID BM  . memantine  10 mg Oral BID  . metoprolol tartrate  12.5 mg Oral BID   Continuous Infusions: . sodium chloride 75 mL/hr at 02/19/19 1200     LOS: 3 days   Rickey Barbara, MD [facility] Pager On Amion  If 7PM-7AM, please contact night-coverage 02/19/2019, 4:53 PM

## 2019-02-19 NOTE — Care Management Important Message (Signed)
Important Message  Patient Details IM Letter given to Lanier Clam RN Case Manager to present to the Patient Name: DYSTANY DUFFY MRN: [id number] Date of Birth: 1925-08-06   Medicare Important Message Given:  Yes     Caren Macadam 02/19/2019, 12:38 PM

## 2019-02-19 NOTE — TOC Progression Note (Signed)
Transition of Care [facility]) - Progression Note    Patient Details  Name: Patricia Carlson MRN: [id number] Date of Birth: 1925/09/16  [facility]) CM/SW Contact  Dasja Brase, Olegario Messier, RN Phone Number: 02/19/2019, 3:25 PM  Clinical Narrative:  Bed offers given to dtr [name] await choice.     Expected Discharge Plan: Skilled Nursing Facility Barriers to Discharge: Continued Medical Work up  Expected Discharge Plan and Services Expected Discharge Plan: Skilled Nursing Facility                                               Social Determinants of Health (SDOH) Interventions    Readmission Risk Interventions No flowsheet data found.

## 2019-02-19 NOTE — Progress Notes (Signed)
Occupational Therapy Treatment Patient Details Name: Patricia Carlson MRN: [id number] DOB: [birth year] Today's Date: 02/19/2019    History of present illness Pt admitted 2* increasing weakness and inability to self-care.  Pt with hx of dementia   OT comments  Pt sitting in chair upon OT arrival.   Pt fatigued, sitting in BM and needed to return to bed.   Follow Up Recommendations  SNF    Equipment Recommendations  3 in 1 bedside commode    Recommendations for Other Services Other (comment)(Palliative)    Precautions / Restrictions         Mobility Bed Mobility Overal bed mobility: Needs Assistance Bed Mobility: Sit to Supine     Supine to sit: Max assist Sit to supine: Total assist;+2 for physical assistance      Transfers Overall transfer level: Needs assistance Equipment used: 2 person hand held assist Transfers: Sit to/from Stand;Stand Pivot Transfers Sit to Stand: +2 physical assistance;Total assist Stand pivot transfers: +2 physical assistance;Total assist            Balance Overall balance assessment: Needs assistance Sitting-balance support: Single extremity supported;Feet supported Sitting balance-Leahy Scale: Poor Sitting balance - Comments: requiring min assist for trunk support                                   ADL either performed or assessed with clinical judgement   ADL Overall ADL's : Needs assistance/impaired     Grooming: Wash/dry face;Minimal assistance;Sitting                   Toilet Transfer: +2 for physical assistance;+2 for safety/equipment;Total assistance;Stand-pivot;Cueing for safety;Cueing for sequencing Toilet Transfer Details (indicate cue type and reason): chair to bed Toileting- Clothing Manipulation and Hygiene: Total assistance;+2 for physical assistance;+2 for safety/equipment         General ADL Comments: pt returned to bed for hygiene as not able to maintain standing position                Cognition Arousal/Alertness: Lethargic Behavior During Therapy: Flat affect                                   General Comments: pt with history of dementia at baseline;pt appeared [facility] for simple commands                   Pertinent Vitals/ Pain       Pain Assessment: No/denies pain     Prior Functioning/Environment              Frequency  Min 2X/week        Progress Toward Goals  OT Goals(current goals can now be found in the care plan section)  Progress towards OT goals: OT to reassess next treatment     Plan Discharge plan remains appropriate       AM-PAC OT "6 Clicks" Daily Activity     Outcome Measure   Help from another person eating meals?: A Lot Help from another person taking care of personal grooming?: A Lot Help from another person toileting, which includes using toliet, bedpan, or urinal?: Total Help from another person bathing (including washing, rinsing, drying)?: A Lot Help from another person to put on and taking off regular upper body clothing?: A Lot Help from another person to put on and taking  off regular lower body clothing?: Total 6 Click Score: 10    End of Session    OT Visit Diagnosis: Unsteadiness on feet (R26.81);Other abnormalities of gait and mobility (R26.89);Muscle weakness (generalized) (M62.81);Other symptoms and signs involving cognitive function   Activity Tolerance Patient limited by fatigue   Patient Left in bed;with call bell/phone within reach;with bed alarm set;with nursing/sitter in room   Nurse Communication Mobility status        Time: 8185-6314 OT Time Calculation (min): 20 min  Charges: OT General Charges $OT Visit: 1 Visit OT Treatments $Self Care/Home Management : 8-22 mins  Patricia Carlson, Patricia Carlson [phone] Office- [phone]      Patricia Carlson, Patricia Carlson D 02/19/2019, 1:10 PM

## 2019-02-19 NOTE — Progress Notes (Signed)
Patient has continued attempts to exit the bed. Patient is repositioned in bed. Bed remains in lowest position with bed alarm set. Call bell within reach. Will continue to monitor.

## 2019-02-20 LAB — COMPREHENSIVE METABOLIC PANEL
ALT: 12 U/L (ref 0–44)
AST: 13 U/L — ABNORMAL LOW (ref 15–41)
Albumin: 2.6 g/dL — ABNORMAL LOW (ref 3.5–5.0)
Alkaline Phosphatase: 59 U/L (ref 38–126)
Anion gap: 8 (ref 5–15)
BUN: 11 mg/dL (ref 8–23)
CO2: 22 mmol/L (ref 22–32)
Calcium: 7.7 mg/dL — ABNORMAL LOW (ref 8.9–10.3)
Chloride: 103 mmol/L (ref 98–111)
Creatinine, Ser: 0.59 mg/dL (ref 0.44–1.00)
GFR calc Af Amer: >60 mL/min (ref >60)
GFR calc non Af Amer: >60 mL/min (ref >60)
Glucose, Bld: 83 mg/dL (ref 70–99)
Potassium: 3.7 mmol/L (ref 3.5–5.1)
Sodium: 133 mmol/L — ABNORMAL LOW (ref 135–145)
Total Bilirubin: 0.6 mg/dL (ref 0.3–1.2)
Total Protein: 5.1 g/dL — ABNORMAL LOW (ref 6.5–8.1)

## 2019-02-20 NOTE — Progress Notes (Signed)
PROGRESS NOTE    Patricia Carlson  MPN:[id number] DOB: 1925-04-02 DOA: 02/15/2019 PCP: Mayra Neer, MD    Brief Narrative:  86 year old female with past medical history significant for dementia, HTN, HLD who presented from home with progressive weakness.  Family has noted progressive decline since Christmas 2020, both functionally and cognitively.  She is also had reduced oral intake, lack of interest in eating or drinking with decreased mobility.  Evaluation in the ED notable for potassium 2.8, sodium 142, creatinine 0.69, phosphorus 2.5.  CT head without acute intracranial abnormality.  Chest x-ray with no acute cardiopulmonary disease process.  KUB with nonobstructive bowel gas pattern.  Patient was referred for admission by ED physician for hypokalemia in the setting of progressive weakness, fatigue and likely failure to thrive.  Assessment & Plan:   Principal Problem:   Hypokalemia Active Problems:   Dementia without behavioral disturbance (HCC)   Dehydration   Adult failure to thrive   Severe protein-calorie malnutrition (HCC)   Hypophosphatemia   Palliative care encounter  Hypokalemia -Potassium 2.8 on admission, etiology likely secondary to poor oral intake. -Corrected -Recheck bmet in AM  Hypophosphatemia -Repleted with 20 mmol potassium phosphate on 02/17/2019. --cont to follow lytes  Adult failure to thrive History of advanced dementia Patient presenting from home, with history of advanced dementia; apparently significant decline cognitively and functionally since Christmas 2020.  Has been living home alone with family helping with cooking.  Was found to be significantly dehydrated, unlikely taking her home medications. --Palliative care consulted and following, appreciate assistance --continues with poor oral intake --Namenda 10 mg p.o. twice daily --Haldol 0.5 mg p.o. every 6 hours as needed for agitation --PT/OT recommend SNF on dc, TOC team for  coordination --Recommend outpatient palliative service to follow -Remains stable at this time  Weakness, fatigue, debility --PT/OT recommend SNF placement. --Continue therapy efforts while inpatient --Plan discharge to SNF with palliative services to follow outpatient -Stable at this time  Essential hypertension BP 129/69, well controlled. --Continue amlodipine 10 mg p.o. daily, metoprolol tartrate 12.5 mg p.o. twice daily  Hyperlipidemia: Atorvastatin 20 mg p.o. nightly -Stable  Severe protein calorie malnutrition Body mass index is 18.54 kg/m. Nutrition Status: Nutrition Problem: Inadequate oral intake Etiology: acute illness, lethargy/confusion, decreased appetite Signs/Symptoms: per patient/family report, meal completion < 25% Interventions: Ensure Enlive (each supplement provides 350kcal and 20 grams of protein), MVI, Hormel Shake, Magic cup, Liberalize Diet -Cont to encourage po as tolerated  DVT prophylaxis: Lovenox subQ Code Status: DNR Family Communication: Pt in room, family not at bedside Disposition Plan: Possible d/c to SNF in 24hrs pending bed availability  Consultants:   Palliative Care  Procedures:     Antimicrobials: Anti-infectives (From admission, onward)   None      Subjective: Complaining of feeling uncomfortable sitting in chair  Objective: Vitals:   02/19/19 1437 02/19/19 2022 02/20/19 0408 02/20/19 1613  BP: (!) 103/52 105/65 109/62 (!) 106/49  Pulse: 68 66 68 81  Resp: 16 18 18 20   Temp:  97.7 F (36.5 C) (!) 97.5 F (36.4 C) 98 F (36.7 C)  TempSrc:   Oral Oral  SpO2:  94% 96% 96%  Weight:      Height:        Intake/Output Summary (Last 24 hours) at 02/20/2019 1646 Last data filed at 02/20/2019 1604 Gross per 24 hour  Intake 1660.68 ml  Output 500 ml  Net 1160.68 ml   Filed Weights   02/16/19 0034  Weight: 49 kg  Examination: General exam: Awake, laying in bed, in nad Respiratory system: Normal respiratory  effort, no wheezing Cardiovascular system: regular rate, s1, s2 Gastrointestinal system: Soft, nondistended, positive BS Central nervous system: CN2-12 grossly intact, strength intact Extremities: Perfused, no clubbing Skin: Normal skin turgor, no notable skin lesions seen Psychiatry: Mood normal // no visual hallucinations   Data Reviewed: I have personally reviewed following labs and imaging studies  CBC: Recent Labs  Lab 02/15/19 1547 02/16/19 0504 02/17/19 0504 02/18/19 0445 02/19/19 0531  WBC 7.3 9.2 7.0 5.7 8.2  NEUTROABS 5.9  --   --   --   --   HGB 14.2 12.7 13.9 11.4* 12.0  HCT 44.5 39.5 41.9 34.9* 35.7*  MCV 102.8* 102.6* 101.2* 99.7 99.7  PLT 155 227 233 206 212   Basic Metabolic Panel: Recent Labs  Lab 02/15/19 1801 02/15/19 2234 02/16/19 0504 02/17/19 0504 02/18/19 0445 02/19/19 0531 02/20/19 0430  NA   < >  --  141 137 137 135 133*  K   < >  --  2.8* 3.6 4.3 3.9 3.7  CL   < >  --  98 97* 102 103 103  CO2   < >  --  32 27 27 25 22   GLUCOSE   < >  --  101* 102* 103* 116* 83  BUN   < >  --  17 15 13 12 11   CREATININE   < >  --  0.74 0.52 0.65 0.59 0.59  CALCIUM   < >  --  7.8* 8.0* 8.1* 8.1* 7.7*  MG  --  2.3 2.2 2.2 1.9 1.9  --   PHOS  --  2.5 2.4* 2.4* 3.9 3.2  --    < > = values in this interval not displayed.   GFR: Estimated Creatinine Clearance: 35.4 mL/min (by C-G formula based on SCr of 0.59 mg/dL). Liver Function Tests: Recent Labs  Lab 02/15/19 1801 02/16/19 0504 02/17/19 0504 02/20/19 0430  AST 19 19 13* 13*  ALT 10 13 11 12   ALKPHOS 56 67 67 59  BILITOT 1.5* 0.9 0.9 0.6  PROT 5.5* 6.0* 6.0* 5.1*  ALBUMIN 2.9* 3.1* 3.2* 2.6*   No results for input(s): LIPASE, AMYLASE in the last 168 hours. No results for input(s): AMMONIA in the last 168 hours. Coagulation Profile: No results for input(s): INR, PROTIME in the last 168 hours. Cardiac Enzymes: Recent Labs  Lab 02/15/19 2234  CKTOTAL 40   BNP (last 3 results) No results for  input(s): PROBNP in the last 8760 hours. HbA1C: No results for input(s): HGBA1C in the last 72 hours. CBG: No results for input(s): GLUCAP in the last 168 hours. Lipid Profile: No results for input(s): CHOL, HDL, LDLCALC, TRIG, CHOLHDL, LDLDIRECT in the last 72 hours. Thyroid Function Tests: No results for input(s): TSH, T4TOTAL, FREET4, T3FREE, THYROIDAB in the last 72 hours. Anemia Panel: No results for input(s): VITAMINB12, FOLATE, FERRITIN, TIBC, IRON, RETICCTPCT in the last 72 hours. Sepsis Labs: Recent Labs  Lab 02/15/19 1547  LATICACIDVEN 1.8    Recent Results (from the past 240 hour(s))  Respiratory Panel by RT PCR (Flu A&B, Covid) - Nasopharyngeal Swab     Status: None   Collection Time: 02/15/19  3:47 PM   Specimen: Nasopharyngeal Swab  Result Value Ref Range Status   SARS Coronavirus 2 by RT PCR NEGATIVE NEGATIVE Final    Comment: (NOTE) SARS-CoV-2 target nucleic acids are NOT DETECTED. The SARS-CoV-2 RNA is generally detectable  in upper respiratoy specimens during the acute phase of infection. The lowest concentration of SARS-CoV-2 viral copies this assay can detect is 131 copies/mL. A negative result does not preclude SARS-Cov-2 infection and should not be used as the sole basis for treatment or other patient management decisions. A negative result may occur with  improper specimen collection/handling, submission of specimen other than nasopharyngeal swab, presence of viral mutation(s) within the areas targeted by this assay, and inadequate number of viral copies (<131 copies/mL). A negative result must be combined with clinical observations, patient history, and epidemiological information. The expected result is Negative. Fact Sheet for Patients:  https://www.moore.com/ Fact Sheet for Healthcare Providers:  https://www.young.biz/ This test is not yet ap proved or cleared by the [place] FDA and  has been authorized  for detection and/or diagnosis of SARS-CoV-2 by FDA under an Emergency Use Authorization (EUA). This EUA will remain  in effect (meaning this test can be used) for the duration of the COVID-19 declaration under Section 564(b)(1) of the Act, 21 U.S.C. section 360bbb-3(b)(1), unless the authorization is terminated or revoked sooner.    Influenza A by PCR NEGATIVE NEGATIVE Final   Influenza B by PCR NEGATIVE NEGATIVE Final    Comment: (NOTE) The Xpert Xpress SARS-CoV-2/FLU/RSV assay is intended as an aid in  the diagnosis of influenza from Nasopharyngeal swab specimens and  should not be used as a sole basis for treatment. Nasal washings and  aspirates are unacceptable for Xpert Xpress SARS-CoV-2/FLU/RSV  testing. Fact Sheet for Patients: https://www.moore.com/ Fact Sheet for Healthcare Providers: https://www.young.biz/ This test is not yet approved or cleared by the [place] FDA and  has been authorized for detection and/or diagnosis of SARS-CoV-2 by  FDA under an Emergency Use Authorization (EUA). This EUA will remain  in effect (meaning this test can be used) for the duration of the  Covid-19 declaration under Section 564(b)(1) of the Act, 21  U.S.C. section 360bbb-3(b)(1), unless the authorization is  terminated or revoked. Performed at [facility], [address], [place], [place] [address]   Urine culture     Status: Abnormal   Collection Time: 02/16/19  8:15 AM   Specimen: Urine, Random  Result Value Ref Range Status   Specimen Description   Final    URINE, RANDOM Performed at [facility], [address] [place], [place] [address]    Special Requests   Final    NONE Performed at [facility], [address] [place], [place] [address]    Culture MULTIPLE SPECIES PRESENT, SUGGEST RECOLLECTION (A)  Final   Report Status 02/17/2019 FINAL  Final  SARS CORONAVIRUS 2 (TAT  6-24 HRS) Nasopharyngeal Nasopharyngeal Swab     Status: None   Collection Time: 02/19/19  3:13 PM   Specimen: Nasopharyngeal Swab  Result Value Ref Range Status   SARS Coronavirus 2 NEGATIVE NEGATIVE Final    Comment: (NOTE) SARS-CoV-2 target nucleic acids are NOT DETECTED. The SARS-CoV-2 RNA is generally detectable in upper and lower respiratory specimens during the acute phase of infection. Negative results do not preclude SARS-CoV-2 infection, do not rule out co-infections with other pathogens, and should not be used as the sole basis for treatment or other patient management decisions. Negative results must be combined with clinical observations, patient history, and epidemiological information. The expected result is Negative. Fact Sheet for Patients: HairSlick.no Fact Sheet for Healthcare Providers: quierodirigir.com This test is not yet approved or cleared by the [place] FDA  and  has been authorized for detection and/or diagnosis of SARS-CoV-2 by FDA under an Emergency Use Authorization (EUA). This EUA will remain  in effect (meaning this test can be used) for the duration of the COVID-19 declaration under Section 56 4(b)(1) of the Act, 21 U.S.C. section 360bbb-3(b)(1), unless the authorization is terminated or revoked sooner. Performed at [facility], [address], [place], [place] [address]      Radiology Studies: No results found.  Scheduled Meds: . amLODipine  10 mg Oral Daily  . atorvastatin  20 mg Oral QHS  . enoxaparin (LOVENOX) injection  30 mg Subcutaneous Q24H  . feeding supplement (ENSURE ENLIVE)  237 mL Oral BID BM  . memantine  10 mg Oral BID  . metoprolol tartrate  12.5 mg Oral BID   Continuous Infusions: . sodium chloride 75 mL/hr at 02/20/19 1206     LOS: 4 days   Rickey Barbara, MD [facility] Pager On Amion  If 7PM-7AM, please contact night-coverage 02/20/2019, 4:46  PM

## 2019-02-20 NOTE — Plan of Care (Signed)
  Problem: Education: Goal: Knowledge of General Education information will improve Description: Including pain rating scale, medication(s)/side effects and non-pharmacologic comfort measures Outcome: Progressing   Problem: Health Behavior/Discharge Planning: Goal: Ability to manage health-related needs will improve Outcome: Progressing   Problem: Clinical Measurements: Goal: Ability to maintain clinical measurements within normal limits will improve Outcome: Progressing Goal: Will remain free from infection Outcome: Progressing Goal: Diagnostic test results will improve Outcome: Progressing   Problem: Activity: Goal: Risk for activity intolerance will decrease Outcome: Progressing   Problem: Nutrition: Goal: Adequate nutrition will be maintained Outcome: Progressing   Problem: Elimination: Goal: Will not experience complications related to bowel motility Outcome: Progressing   Problem: Safety: Goal: Ability to remain free from injury will improve Outcome: Progressing   Problem: Skin Integrity: Goal: Risk for impaired skin integrity will decrease Outcome: Progressing   

## 2019-02-20 NOTE — TOC Progression Note (Signed)
Transition of Care [facility]) - Progression Note    Patient Details  Name: Patricia Carlson MRN: [id number] Date of Birth: [birth year]  Transition of Care [facility]) CM/SW Contact  Divonte Senger, Olegario Messier, RN Phone Number: 02/20/2019, 2:13 PM  Clinical Narrative:   D/c [address] in am. Covid1/13 neg.   Expected Discharge Plan: Skilled Nursing Facility Barriers to Discharge: Continued Medical Work up  Expected Discharge Plan and Services Expected Discharge Plan: Skilled Nursing Facility                                               Social Determinants of Health (SDOH) Interventions    Readmission Risk Interventions No flowsheet data found.

## 2019-02-21 MED ORDER — LACTATED RINGERS IV SOLN: INTRAVENOUS | Status: DC

## 2019-02-21 MED ORDER — HALOPERIDOL 0.5 MG PO TABS: 0.5000 mg | ORAL_TABLET | Freq: Four times a day (QID) | ORAL | 0 refills | Status: AC | PRN

## 2019-02-21 MED ORDER — ATORVASTATIN CALCIUM 20 MG PO TABS: 20.0000 mg | ORAL_TABLET | Freq: Every day | ORAL | 0 refills | Status: AC

## 2019-02-21 MED ORDER — LACTATED RINGERS IV BOLUS
500.0000 mL | Freq: Once | INTRAVENOUS | Status: AC
  Administered 2019-02-21: 500 mL via INTRAVENOUS

## 2019-02-21 MED ORDER — LACTATED RINGERS IV BOLUS
1000.0000 mL | Freq: Once | INTRAVENOUS | Status: AC
  Administered 2019-02-21: 1000 mL via INTRAVENOUS

## 2019-02-21 MED ORDER — METOPROLOL TARTRATE 25 MG PO TABS: 12.5000 mg | ORAL_TABLET | Freq: Two times a day (BID) | ORAL | 0 refills | Status: AC

## 2019-02-21 NOTE — Progress Notes (Signed)
Physical Therapy Treatment Patient Details Name: Patricia Carlson MRN: [id number] DOB: 05/04/1925 Today's Date: 02/21/2019    History of Present Illness Pt admitted 2* increasing weakness and inability to self-care.  Pt with hx of dementia    PT Comments    Pt able to take some side steps today.  She continues to need +2 A.  Con't to recommend SNF.   Follow Up Recommendations  SNF     Equipment Recommendations  None recommended by PT    Recommendations for Other Services       Precautions / Restrictions Precautions Precautions: Fall Restrictions Weight Bearing Restrictions: No    Mobility  Bed Mobility Overal bed mobility: Needs Assistance Bed Mobility: Supine to Sit;Sit to Supine     Supine to sit: Mod assist;+2 for physical assistance Sit to supine: Max assist;+2 for physical assistance   General bed mobility comments: Pt able to initiate transfer to OEB with legs, but needs A with trunk  Transfers Overall transfer level: Needs assistance Equipment used: 2 person hand held assist Transfers: Sit to/from Stand Sit to Stand: Min assist;+2 physical assistance         General transfer comment: Stood fairly well with 2 person HHA and feet blocked  Ambulation/Gait Ambulation/Gait assistance: Mod assist;+2 physical assistance Gait Distance (Feet): 3 Feet Assistive device: 2 person hand held assist Gait Pattern/deviations: Step-to pattern     General Gait Details: Side stepping to the R with HHA with weakness noted.  Pt sat and then stepped another foot. Noted new BM on pad and Pt stood with MAX to TOT A to get cleaned.   Stairs             Wheelchair Mobility    Modified Rankin (Stroke Patients Only)       Balance   Sitting-balance support: Feet supported;Single extremity supported Sitting balance-Leahy Scale: Poor Sitting balance - Comments: constant min/guard with sitting due to general weakness and sway   Standing balance support:  Bilateral upper extremity supported Standing balance-Leahy Scale: Poor Standing balance comment: Stood with Poor balance, but as she fatigued went to zero while being cleaned.                            Cognition Arousal/Alertness: Awake/alert                                            Exercises      General Comments        Pertinent Vitals/Pain Pain Assessment: Faces Faces Pain Scale: Hurts little more Pain Location: back Pain Descriptors / Indicators: Sore Pain Intervention(s): Limited activity within patient's tolerance;Monitored during session;Repositioned    Home Living                      Prior Function            PT Goals (current goals can now be found in the care plan section) Acute Rehab PT Goals PT Goal Formulation: With patient Time For Goal Achievement: 03/02/19 Potential to Achieve Goals: Fair Progress towards PT goals: Progressing toward goals    Frequency    Min 2X/week      PT Plan Current plan remains appropriate    Co-evaluation              AM-PAC PT "  6 Clicks" Mobility   Outcome Measure  Help needed turning from your back to your side while in a flat bed without using bedrails?: A Little Help needed moving from lying on your back to sitting on the side of a flat bed without using bedrails?: A Lot Help needed moving to and from a bed to a chair (including a wheelchair)?: A Lot Help needed standing up from a chair using your arms (e.g., wheelchair or bedside chair)?: A Lot Help needed to walk in hospital room?: A Lot Help needed climbing 3-5 steps with a railing? : Total 6 Click Score: 12    End of Session     Patient left: in bed;with call bell/phone within reach;with bed alarm set Nurse Communication: Mobility status PT Visit Diagnosis: Difficulty in walking, not elsewhere classified (R26.2);Muscle weakness (generalized) (M62.81)     Time: 5397-6734 PT Time Calculation (min) (ACUTE  ONLY): 10 min  Charges:  $Therapeutic Activity: 8-22 mins                     Patricia Carlson, [place] Pager [phone] 02/21/2019    Galen Manila 02/21/2019, 11:51 AM

## 2019-02-21 NOTE — Discharge Summary (Signed)
Physician Discharge Summary  Patricia Carlson ZOX:[id number] DOB: 06-02-1925 DOA: 02/15/2019  PCP: Lupita Raider, MD  Admit date: 02/15/2019 Discharge date: 02/21/2019  Admitted From: Home Disposition:  SNF  Recommendations for Outpatient Follow-up:  1. Follow up with PCP in 1-2 weeks 2. Recommend referral to Palliative Care 3. Please encourage and assist patient to eat  Discharge Condition:Stable CODE STATUS:DNR Diet recommendation: Regular   Brief/Interim Summary: 86 year old female with past medical history significant for dementia, HTN, HLD who presented from home with progressive weakness. Family has noted progressive decline since Christmas 2020, both functionally and cognitively. She is also had reduced oral intake, lack of interest in eating or drinking with decreased mobility.  Evaluation in the ED notable for potassium 2.8, sodium 142, creatinine 0.69, phosphorus 2.5. CT head without acute intracranial abnormality. Chest x-ray with no acute cardiopulmonary disease process. KUB with nonobstructive bowel gas pattern. Patient was referred for admission by ED physician for hypokalemia in the setting of progressive weakness, fatigue and likely failure to thrive.  Discharge Diagnoses:  Principal Problem:   Hypokalemia Active Problems:   Dementia without behavioral disturbance (HCC)   Dehydration   Adult failure to thrive   Severe protein-calorie malnutrition (HCC)   Hypophosphatemia   Palliative care encounter   Hypokalemia -Potassium 2.8 on admission, etiology likely secondary to poor oral intake. -Corrected  Hypophosphatemia -Repleted with 20 mmol potassium phosphate on 02/17/2019.  Adult failure to thrive History of advanced dementia Patient presenting from home, with history of advanced dementia; apparently significant decline cognitively and functionally since Christmas 2020. Has been living home alone with family helping with cooking. Was found to be  significantly dehydrated, unlikely taking her home medications. --Palliative care consulted and following, appreciate assistance --continues with poor oral intake --Namenda 10 mg p.o. twice daily --Haldol 0.5 mg p.o. every 6 hours as needed for agitation --PT/OT recommend SNF on dc, TOC team for coordination --Recommend outpatient palliative service to follow -Remains stable at this time  Weakness, fatigue, debility --PT/OT recommend SNF placement. --Continue therapy efforts while inpatient --Plan discharge to SNF with palliative services to follow outpatient -Stable at this time  Essential hypertension BP 129/69, well controlled. --Continue amlodipine 10 mg p.o. daily, metoprolol tartrate 12.5 mg p.o. twice daily  Hyperlipidemia: Atorvastatin 20 mg p.o. nightly -Stable  Severe protein calorie malnutrition Body mass index is 18.54 kg/m. Nutrition Status: Nutrition Problem: Inadequate oral intake Etiology: acute illness, lethargy/confusion, decreased appetite Signs/Symptoms: per patient/family report, meal completion < 25% Interventions: Ensure Enlive (each supplement provides 350kcal and 20 grams of protein), MVI, Hormel Shake, Magic cup, Liberalize Diet -Cont to encourage po as tolerated  Discharge Instructions   Allergies as of 02/21/2019   No Known Allergies     Medication List    STOP taking these medications   lisinopril 10 MG tablet Commonly known as: ZESTRIL   simvastatin 40 MG tablet Commonly known as: ZOCOR     TAKE these medications   amLODipine 10 MG tablet Commonly known as: NORVASC Take 10 mg by mouth daily.   atorvastatin 20 MG tablet Commonly known as: LIPITOR Take 1 tablet (20 mg total) by mouth at bedtime.   donepezil 10 MG tablet Commonly known as: ARICEPT Take 10 mg by mouth daily.   haloperidol 0.5 MG tablet Commonly known as: HALDOL Take 1 tablet (0.5 mg total) by mouth every 6 (six) hours as needed for agitation.   memantine  10 MG tablet Commonly known as: NAMENDA Take 10 mg by mouth 2 (  two) times daily.   metoprolol tartrate 25 MG tablet Commonly known as: LOPRESSOR Take 0.5 tablets (12.5 mg total) by mouth 2 (two) times daily.      Follow-up Information    Lupita RaiderShaw, Kimberlee, MD. Schedule an appointment as soon as possible for a visit in 2 week(s).   Specialty: Family Medicine Contact information: [address] [place] [address] [phone]          No Known Allergies  Consultations:  Palliative Care  Procedures/Studies: DG Abd 1 View  Result Date: 02/15/2019 CLINICAL DATA:  Diarrhea EXAM: ABDOMEN - 1 VIEW COMPARISON:  None. FINDINGS: The bowel gas pattern is normal. No radio-opaque calculi or other significant radiographic abnormality are seen. There is a dextroconvex scoliotic curvature of the lumbar spine. IMPRESSION: Nonobstructive bowel gas pattern. Electronically Signed   By: Jonna ClarkBindu  Avutu M.D.   On: 02/15/2019 22:55   CT Head Wo Contrast  Result Date: 02/15/2019 CLINICAL DATA:  Altered mental status EXAM: CT HEAD WITHOUT CONTRAST TECHNIQUE: Contiguous axial images were obtained from the base of the skull through the vertex without intravenous contrast. COMPARISON:  None. FINDINGS: Brain: No evidence of acute infarction, hemorrhage, hydrocephalus, extra-axial collection or mass lesion/mass effect. There is mild cerebral volume loss with associated ex vacuo dilatation. Periventricular white matter hypoattenuation likely represents chronic small vessel ischemic disease. Vascular: There are vascular calcifications in the carotid siphons. Skull: Normal. Negative for fracture or focal lesion. Sinuses/Orbits: No acute finding. Other: None. IMPRESSION: 1. No acute intracranial process. Electronically Signed   By: Romona Curlsyler  Litton M.D.   On: 02/15/2019 16:21   DG Chest Port 1 View  Result Date: 02/15/2019 CLINICAL DATA:  Weakness EXAM: PORTABLE CHEST 1 VIEW COMPARISON:  CT chest dated  02/20/2005 and chest radiograph dated 02/01/2005. FINDINGS: The heart size and mediastinal contours are within normal limits. Both lungs are clear. The visualized skeletal structures are unremarkable. IMPRESSION: No active disease. Electronically Signed   By: Romona Curlsyler  Litton M.D.   On: 02/15/2019 16:17     Subjective: Without complaints  Discharge Exam: Vitals:   02/21/19 0601 02/21/19 0836  BP: (!) 120/54 (!) 121/50  Pulse: 83 91  Resp: 18   Temp: 97.8 F (36.6 C)   SpO2: 93%    Vitals:   02/20/19 1613 02/20/19 2130 02/21/19 0601 02/21/19 0836  BP: (!) 106/49 (!) 106/46 (!) 120/54 (!) 121/50  Pulse: 81 72 83 91  Resp: 20 16 18    Temp: 98 F (36.7 C) 98 F (36.7 C) 97.8 F (36.6 C)   TempSrc: Oral Oral Oral   SpO2: 96% 93% 93%   Weight:      Height:        General: Pt is alert, awake, not in acute distress Cardiovascular: RRR, S1/S2 +, no rubs, no gallops Respiratory: CTA bilaterally, no wheezing, no rhonchi Abdominal: Soft, NT, ND, bowel sounds + Extremities: no edema, no cyanosis   The results of significant diagnostics from this hospitalization (including imaging, microbiology, ancillary and laboratory) are listed below for reference.     Microbiology: Recent Results (from the past 240 hour(s))  Respiratory Panel by RT PCR (Flu A&B, Covid) - Nasopharyngeal Swab     Status: None   Collection Time: 02/15/19  3:47 PM   Specimen: Nasopharyngeal Swab  Result Value Ref Range Status   SARS Coronavirus 2 by RT PCR NEGATIVE NEGATIVE Final    Comment: (NOTE) SARS-CoV-2 target nucleic acids are NOT DETECTED. The SARS-CoV-2 RNA is generally  detectable in upper respiratoy specimens during the acute phase of infection. The lowest concentration of SARS-CoV-2 viral copies this assay can detect is 131 copies/mL. A negative result does not preclude SARS-Cov-2 infection and should not be used as the sole basis for treatment or other patient management decisions. A negative  result may occur with  improper specimen collection/handling, submission of specimen other than nasopharyngeal swab, presence of viral mutation(s) within the areas targeted by this assay, and inadequate number of viral copies (<131 copies/mL). A negative result must be combined with clinical observations, patient history, and epidemiological information. The expected result is Negative. Fact Sheet for Patients:  https://www.moore.com/ Fact Sheet for Healthcare Providers:  https://www.young.biz/ This test is not yet ap proved or cleared by the [place] FDA and  has been authorized for detection and/or diagnosis of SARS-CoV-2 by FDA under an Emergency Use Authorization (EUA). This EUA will remain  in effect (meaning this test can be used) for the duration of the COVID-19 declaration under Section 564(b)(1) of the Act, 21 U.S.C. section 360bbb-3(b)(1), unless the authorization is terminated or revoked sooner.    Influenza A by PCR NEGATIVE NEGATIVE Final   Influenza B by PCR NEGATIVE NEGATIVE Final    Comment: (NOTE) The Xpert Xpress SARS-CoV-2/FLU/RSV assay is intended as an aid in  the diagnosis of influenza from Nasopharyngeal swab specimens and  should not be used as a sole basis for treatment. Nasal washings and  aspirates are unacceptable for Xpert Xpress SARS-CoV-2/FLU/RSV  testing. Fact Sheet for Patients: https://www.moore.com/ Fact Sheet for Healthcare Providers: https://www.young.biz/ This test is not yet approved or cleared by the [place] FDA and  has been authorized for detection and/or diagnosis of SARS-CoV-2 by  FDA under an Emergency Use Authorization (EUA). This EUA will remain  in effect (meaning this test can be used) for the duration of the  Covid-19 declaration under Section 564(b)(1) of the Act, 21  U.S.C. section 360bbb-3(b)(1), unless the authorization is  terminated or  revoked. Performed at [facility], [address], [place], [place] [address]   Urine culture     Status: Abnormal   Collection Time: 02/16/19  8:15 AM   Specimen: Urine, Random  Result Value Ref Range Status   Specimen Description   Final    URINE, RANDOM Performed at [facility], [address] [place], [place] [address]    Special Requests   Final    NONE Performed at [facility], [address] [place], [place] [address]    Culture MULTIPLE SPECIES PRESENT, SUGGEST RECOLLECTION (A)  Final   Report Status 02/17/2019 FINAL  Final  SARS CORONAVIRUS 2 (TAT 6-24 HRS) Nasopharyngeal Nasopharyngeal Swab     Status: None   Collection Time: 02/19/19  3:13 PM   Specimen: Nasopharyngeal Swab  Result Value Ref Range Status   SARS Coronavirus 2 NEGATIVE NEGATIVE Final    Comment: (NOTE) SARS-CoV-2 target nucleic acids are NOT DETECTED. The SARS-CoV-2 RNA is generally detectable in upper and lower respiratory specimens during the acute phase of infection. Negative results do not preclude SARS-CoV-2 infection, do not rule out co-infections with other pathogens, and should not be used as the sole basis for treatment or other patient management decisions. Negative results must be combined with clinical observations, patient history, and epidemiological information. The expected result is Negative. Fact Sheet for Patients: HairSlick.no Fact Sheet for Healthcare Providers: quierodirigir.com This test is not yet approved or cleared by the [place]  FDA and  has been authorized for detection and/or diagnosis of SARS-CoV-2 by FDA under an Emergency Use Authorization (EUA). This EUA will remain  in effect (meaning this test can be used) for the duration of the COVID-19 declaration under Section 56 4(b)(1) of the Act, 21 U.S.C. section 360bbb-3(b)(1), unless the authorization  is terminated or revoked sooner. Performed at [facility], [address] [place], [place] [address]      Labs: BNP (last 3 results) No results for input(s): BNP in the last 8760 hours. Basic Metabolic Panel: Recent Labs  Lab 02/15/19 1801 02/15/19 2234 02/16/19 0504 02/17/19 0504 02/18/19 0445 02/19/19 0531 02/20/19 0430  NA   < >  --  141 137 137 135 133*  K   < >  --  2.8* 3.6 4.3 3.9 3.7  CL   < >  --  98 97* 102 103 103  CO2   < >  --  32 27 27 25 22   GLUCOSE   < >  --  101* 102* 103* 116* 83  BUN   < >  --  17 15 13 12 11   CREATININE   < >  --  0.74 0.52 0.65 0.59 0.59  CALCIUM   < >  --  7.8* 8.0* 8.1* 8.1* 7.7*  MG  --  2.3 2.2 2.2 1.9 1.9  --   PHOS  --  2.5 2.4* 2.4* 3.9 3.2  --    < > = values in this interval not displayed.   Liver Function Tests: Recent Labs  Lab 02/15/19 1801 02/16/19 0504 02/17/19 0504 02/20/19 0430  AST 19 19 13* 13*  ALT 10 13 11 12   ALKPHOS 56 67 67 59  BILITOT 1.5* 0.9 0.9 0.6  PROT 5.5* 6.0* 6.0* 5.1*  ALBUMIN 2.9* 3.1* 3.2* 2.6*   No results for input(s): LIPASE, AMYLASE in the last 168 hours. No results for input(s): AMMONIA in the last 168 hours. CBC: Recent Labs  Lab 02/15/19 1547 02/16/19 0504 02/17/19 0504 02/18/19 0445 02/19/19 0531  WBC 7.3 9.2 7.0 5.7 8.2  NEUTROABS 5.9  --   --   --   --   HGB 14.2 12.7 13.9 11.4* 12.0  HCT 44.5 39.5 41.9 34.9* 35.7*  MCV 102.8* 102.6* 101.2* 99.7 99.7  PLT 155 227 233 206 212   Cardiac Enzymes: Recent Labs  Lab 02/15/19 2234  CKTOTAL 40   BNP: Invalid input(s): POCBNP CBG: No results for input(s): GLUCAP in the last 168 hours. D-Dimer No results for input(s): DDIMER in the last 72 hours. Hgb A1c No results for input(s): HGBA1C in the last 72 hours. Lipid Profile No results for input(s): CHOL, HDL, LDLCALC, TRIG, CHOLHDL, LDLDIRECT in the last 72 hours. Thyroid function studies No results for input(s): TSH, T4TOTAL, T3FREE, THYROIDAB in the last 72  hours.  Invalid input(s): FREET3 Anemia work up No results for input(s): VITAMINB12, FOLATE, FERRITIN, TIBC, IRON, RETICCTPCT in the last 72 hours. Urinalysis    Component Value Date/Time   COLORURINE AMBER (A) 02/16/2019 0815   APPEARANCEUR HAZY (A) 02/16/2019 0815   LABSPEC 1.019 02/16/2019 0815   PHURINE 6.0 02/16/2019 0815   GLUCOSEU NEGATIVE 02/16/2019 0815   HGBUR NEGATIVE 02/16/2019 0815   BILIRUBINUR NEGATIVE 02/16/2019 0815   KETONESUR NEGATIVE 02/16/2019 0815   PROTEINUR 30 (A) 02/16/2019 0815   NITRITE NEGATIVE 02/16/2019 0815   LEUKOCYTESUR NEGATIVE 02/16/2019 0815   Sepsis Labs Invalid input(s): PROCALCITONIN,  WBC,  LACTICIDVEN  Microbiology Recent Results (from the past 240 hour(s))  Respiratory Panel by RT PCR (Flu A&B, Covid) - Nasopharyngeal Swab     Status: None   Collection Time: 02/15/19  3:47 PM   Specimen: Nasopharyngeal Swab  Result Value Ref Range Status   SARS Coronavirus 2 by RT PCR NEGATIVE NEGATIVE Final    Comment: (NOTE) SARS-CoV-2 target nucleic acids are NOT DETECTED. The SARS-CoV-2 RNA is generally detectable in upper respiratoy specimens during the acute phase of infection. The lowest concentration of SARS-CoV-2 viral copies this assay can detect is 131 copies/mL. A negative result does not preclude SARS-Cov-2 infection and should not be used as the sole basis for treatment or other patient management decisions. A negative result may occur with  improper specimen collection/handling, submission of specimen other than nasopharyngeal swab, presence of viral mutation(s) within the areas targeted by this assay, and inadequate number of viral copies (<131 copies/mL). A negative result must be combined with clinical observations, patient history, and epidemiological information. The expected result is Negative. Fact Sheet for Patients:  PinkCheek.be Fact Sheet for Healthcare Providers:   GravelBags.it This test is not yet ap proved or cleared by the [place] FDA and  has been authorized for detection and/or diagnosis of SARS-CoV-2 by FDA under an Emergency Use Authorization (EUA). This EUA will remain  in effect (meaning this test can be used) for the duration of the COVID-19 declaration under Section 564(b)(1) of the Act, 21 U.S.C. section 360bbb-3(b)(1), unless the authorization is terminated or revoked sooner.    Influenza A by PCR NEGATIVE NEGATIVE Final   Influenza B by PCR NEGATIVE NEGATIVE Final    Comment: (NOTE) The Xpert Xpress SARS-CoV-2/FLU/RSV assay is intended as an aid in  the diagnosis of influenza from Nasopharyngeal swab specimens and  should not be used as a sole basis for treatment. Nasal washings and  aspirates are unacceptable for Xpert Xpress SARS-CoV-2/FLU/RSV  testing. Fact Sheet for Patients: PinkCheek.be Fact Sheet for Healthcare Providers: GravelBags.it This test is not yet approved or cleared by the [place] FDA and  has been authorized for detection and/or diagnosis of SARS-CoV-2 by  FDA under an Emergency Use Authorization (EUA). This EUA will remain  in effect (meaning this test can be used) for the duration of the  Covid-19 declaration under Section 564(b)(1) of the Act, 21  U.S.C. section 360bbb-3(b)(1), unless the authorization is  terminated or revoked. Performed at [facility], [address], [place], [place] [address]   Urine culture     Status: Abnormal   Collection Time: 02/16/19  8:15 AM   Specimen: Urine, Random  Result Value Ref Range Status   Specimen Description   Final    URINE, RANDOM Performed at [facility] [address] [place], [place] [address]    Special Requests   Final    NONE Performed at [facility], [place] [address] [place],  [address]     Culture MULTIPLE SPECIES PRESENT, SUGGEST RECOLLECTION (A)  Final   Report Status 02/17/2019 FINAL  Final  SARS CORONAVIRUS 2 (TAT 6-24 HRS) Nasopharyngeal Nasopharyngeal Swab     Status: None   Collection Time: 02/19/19  3:13 PM   Specimen: Nasopharyngeal Swab  Result Value Ref Range Status   SARS Coronavirus 2 NEGATIVE NEGATIVE Final    Comment: (NOTE) SARS-CoV-2 target nucleic acids are NOT DETECTED. The SARS-CoV-2 RNA is generally detectable in upper and lower respiratory specimens during the acute phase of  infection. Negative results do not preclude SARS-CoV-2 infection, do not rule out co-infections with other pathogens, and should not be used as the sole basis for treatment or other patient management decisions. Negative results must be combined with clinical observations, patient history, and epidemiological information. The expected result is Negative. Fact Sheet for Patients: HairSlick.no Fact Sheet for Healthcare Providers: quierodirigir.com This test is not yet approved or cleared by the [place] FDA and  has been authorized for detection and/or diagnosis of SARS-CoV-2 by FDA under an Emergency Use Authorization (EUA). This EUA will remain  in effect (meaning this test can be used) for the duration of the COVID-19 declaration under Section 56 4(b)(1) of the Act, 21 U.S.C. section 360bbb-3(b)(1), unless the authorization is terminated or revoked sooner. Performed at [facility], [address], [place], [place] [address]    Time spent: 30 min  SIGNED:   Rickey Barbara, MD  [facility] 02/21/2019, 11:54 AM  If 7PM-7AM, please contact night-coverage

## 2019-02-21 NOTE — Progress Notes (Signed)
Dr. Rhona Leavens notified patient has not voided. Writer bladder scanned patient. Bladder scanned 59ml. MD order IV bolus

## 2019-02-21 NOTE — Progress Notes (Signed)
Attempted to call [facility] times three. No answer, left number for the nurse to call back.

## 2019-02-21 NOTE — Progress Notes (Signed)
Notified Dr Selena Batten of No urine output all night.

## 2019-02-21 NOTE — TOC Progression Note (Signed)
Transition of Care [facility]) - Progression Note    Patient Details  Name: Patricia Carlson MRN: [id number] Date of Birth: [birth year]  Transition of Care [facility]) CM/SW Contact  Chevette Fee, Olegario Messier, RN Phone Number: 02/21/2019, 4:14 PM  Clinical Narrative: d/c cancelled. TC [facility] rep Kenisha-bed available tomorrow just call/text  Everardo Pacific to confirm.      Expected Discharge Plan: Skilled Nursing Facility Barriers to Discharge: Continued Medical Work up  Expected Discharge Plan and Services Expected Discharge Plan: Skilled Nursing Facility         Expected Discharge Date: 02/21/19                                     Social Determinants of Health (SDOH) Interventions    Readmission Risk Interventions No flowsheet data found.

## 2019-02-21 NOTE — Progress Notes (Signed)
Patricia Carlson notified of no urine output and bladder scan of .

## 2019-02-22 DIAGNOSIS — T17998A Other foreign object in respiratory tract, part unspecified causing other injury, initial encounter: Secondary | ICD-10-CM | POA: Diagnosis not present

## 2019-02-22 DIAGNOSIS — R5381 Other malaise: Secondary | ICD-10-CM | POA: Diagnosis not present

## 2019-02-22 DIAGNOSIS — E7849 Other hyperlipidemia: Secondary | ICD-10-CM | POA: Diagnosis not present

## 2019-02-22 DIAGNOSIS — R1310 Dysphagia, unspecified: Secondary | ICD-10-CM | POA: Diagnosis not present

## 2019-02-22 DIAGNOSIS — R627 Adult failure to thrive: Secondary | ICD-10-CM | POA: Diagnosis not present

## 2019-02-22 DIAGNOSIS — I959 Hypotension, unspecified: Secondary | ICD-10-CM | POA: Diagnosis not present

## 2019-02-22 DIAGNOSIS — R2689 Other abnormalities of gait and mobility: Secondary | ICD-10-CM | POA: Diagnosis not present

## 2019-02-22 DIAGNOSIS — E43 Unspecified severe protein-calorie malnutrition: Secondary | ICD-10-CM | POA: Diagnosis not present

## 2019-02-22 DIAGNOSIS — Z7189 Other specified counseling: Secondary | ICD-10-CM | POA: Diagnosis not present

## 2019-02-22 DIAGNOSIS — M6281 Muscle weakness (generalized): Secondary | ICD-10-CM | POA: Diagnosis not present

## 2019-02-22 DIAGNOSIS — R4182 Altered mental status, unspecified: Secondary | ICD-10-CM | POA: Diagnosis not present

## 2019-02-22 DIAGNOSIS — E876 Hypokalemia: Secondary | ICD-10-CM | POA: Diagnosis not present

## 2019-02-22 DIAGNOSIS — I10 Essential (primary) hypertension: Secondary | ICD-10-CM | POA: Diagnosis not present

## 2019-02-22 DIAGNOSIS — R0902 Hypoxemia: Secondary | ICD-10-CM | POA: Diagnosis not present

## 2019-02-22 DIAGNOSIS — F0390 Unspecified dementia without behavioral disturbance: Secondary | ICD-10-CM | POA: Diagnosis not present

## 2019-02-22 DIAGNOSIS — M2550 Pain in unspecified joint: Secondary | ICD-10-CM | POA: Diagnosis not present

## 2019-02-22 DIAGNOSIS — E860 Dehydration: Secondary | ICD-10-CM | POA: Diagnosis not present

## 2019-02-22 DIAGNOSIS — E440 Moderate protein-calorie malnutrition: Secondary | ICD-10-CM | POA: Diagnosis not present

## 2019-02-22 DIAGNOSIS — R41841 Cognitive communication deficit: Secondary | ICD-10-CM | POA: Diagnosis not present

## 2019-02-22 DIAGNOSIS — F0280 Dementia in other diseases classified elsewhere without behavioral disturbance: Secondary | ICD-10-CM | POA: Diagnosis not present

## 2019-02-22 DIAGNOSIS — U071 COVID-19: Secondary | ICD-10-CM | POA: Diagnosis not present

## 2019-02-22 DIAGNOSIS — R278 Other lack of coordination: Secondary | ICD-10-CM | POA: Diagnosis not present

## 2019-02-22 DIAGNOSIS — Z7401 Bed confinement status: Secondary | ICD-10-CM | POA: Diagnosis not present

## 2019-02-22 MED ORDER — CHLORHEXIDINE GLUCONATE CLOTH 2 % EX PADS: 6.0000 | MEDICATED_PAD | Freq: Every day | CUTANEOUS | Status: DC

## 2019-02-22 NOTE — TOC Transition Note (Signed)
Transition of Care [facility]) - CM/SW Discharge Note   Patient Details  Name: Patricia Carlson MRN: [id number] Date of Birth: [birth year]  [facility]) CM/SW Contact:  Coralyn Helling, LCSW Phone Number: 02/22/2019, 1:08 PM   Clinical Narrative:   Patient to transfer to [facility] by PTAR. Patient to report to room 101P. RN report # [id number].    Final next level of care: Skilled Nursing Facility Barriers to Discharge: No Barriers Identified   Patient Goals and CMS Choice   CMS Medicare.gov Compare Post Acute Care list provided to:: Patient Represenative (must comment)(dtr [name] [phone])    Discharge Placement              Patient chooses bed at: [facility] Patient to be transferred to facility by: EMS   Patient and family notified of of transfer: 02/22/19  Discharge Plan and Services                                     Social Determinants of Health (SDOH) Interventions     Readmission Risk Interventions No flowsheet data found.

## 2019-02-22 NOTE — Progress Notes (Signed)
Attempted to call report to [facility] x2 with no answer. Left name and call back number for return call.

## 2019-02-22 NOTE — Progress Notes (Signed)
Per report, pt has been retaining urine and receiving in and out catheterization.  Bladder scan showed >407. Per protocol, 14 french foley catheter inserted. clear, yellow urine returned. Patient tolerated well.

## 2019-02-22 NOTE — Progress Notes (Signed)
Attempted to call Patricia Carlson for report on patient again, no answer x2.

## 2019-02-22 NOTE — Progress Notes (Signed)
[facility] notified of no urine output since last IO at 0045.

## 2019-02-22 NOTE — Progress Notes (Signed)
PROGRESS NOTE    Patricia Carlson  PFX:[id number] DOB: [birth year] DOA: 02/15/2019 PCP: Lupita Raider, MD    Brief Narrative:  86 year old female with past medical history significant for dementia, HTN, HLD who presented from home with progressive weakness.  Family has noted progressive decline since Christmas 2020, both functionally and cognitively.  She is also had reduced oral intake, lack of interest in eating or drinking with decreased mobility.  Evaluation in the ED notable for potassium 2.8, sodium 142, creatinine 0.69, phosphorus 2.5.  CT head without acute intracranial abnormality.  Chest x-ray with no acute cardiopulmonary disease process.  KUB with nonobstructive bowel gas pattern.  Patient was referred for admission by ED physician for hypokalemia in the setting of progressive weakness, fatigue and likely failure to thrive.  Assessment & Plan:   Principal Problem:   Hypokalemia Active Problems:   Dementia without behavioral disturbance (HCC)   Dehydration   Adult failure to thrive   Severe protein-calorie malnutrition (HCC)   Hypophosphatemia   Palliative care encounter  Hypokalemia -Potassium 2.8 on admission, etiology likely secondary to poor oral intake. -Corrected  Hypophosphatemia -Repleted with 20 mmol potassium phosphate on 02/17/2019.  Adult failure to thrive History of advanced dementia Patient presenting from home, with history of advanced dementia; apparently significant decline cognitively and functionally since Christmas 2020.  Has been living home alone with family helping with cooking.  Was found to be significantly dehydrated, unlikely taking her home medications. --Palliative care consulted and following, appreciate assistance --continues with poor oral intake --Namenda 10 mg p.o. twice daily --Haldol 0.5 mg p.o. every 6 hours as needed for agitation --PT/OT recommend SNF on dc, TOC team for coordination --Recommend outpatient palliative  service to follow -Remains stable at this time  Weakness, fatigue, debility --PT/OT recommend SNF placement. --Continue therapy efforts while inpatient --Plan discharge to SNF with palliative services to follow outpatient  Essential hypertension BP 129/69, well controlled. --Continue amlodipine 10 mg p.o. daily, metoprolol tartrate 12.5 mg p.o. twice daily  Hyperlipidemia: Atorvastatin 20 mg p.o. nightly -Stable  Dehydration -discharge for 1/15 held secondary to concerns of dehydration and poor urine output -Minimal urine on bladder scan -Pt given IVF bolus followed by IVF overnight, now with good urine output -Have shared concerns with patient's daughter that patient's advanced dementia is likely the culprit of poor PO intake and thus dehydration and that recurrent episode will most certainly happen again in the future. Recommend Palliative Care referral as per above  Severe protein calorie malnutrition Body mass index is 18.54 kg/m. Nutrition Status: Nutrition Problem: Inadequate oral intake Etiology: acute illness, lethargy/confusion, decreased appetite Signs/Symptoms: per patient/family report, meal completion < 25% Interventions: Ensure Enlive (each supplement provides 350kcal and 20 grams of protein), MVI, Hormel Shake, Magic cup, Liberalize Diet -Cont to encourage po as tolerated  DVT prophylaxis: Lovenox subQ Code Status: DNR Family Communication: Pt in room, family over phone Disposition Plan: D/c SNF today  Consultants:   Palliative Care  Procedures:     Antimicrobials: Anti-infectives (From admission, onward)   None      Subjective: Without complaints  Objective: Vitals:   02/21/19 2133 02/21/19 2133 02/22/19 0559 02/22/19 0800  BP: (!) 108/44 (!) 108/44 139/61 124/60  Pulse: 91 91 90 92  Resp:  20 16 16   Temp:  98.6 F (37 C) 97.8 F (36.6 C) 97.9 F (36.6 C)  TempSrc:  Oral Oral Oral  SpO2:  91% 90% 90%  Weight:      Height:  Intake/Output Summary (Last 24 hours) at 02/22/2019 1042 Last data filed at 02/22/2019 0300 Gross per 24 hour  Intake 1501.75 ml  Output 600 ml  Net 901.75 ml   Filed Weights   02/16/19 0034  Weight: 49 kg    Examination: General exam: Awake, laying in bed, in nad Respiratory system: Normal respiratory effort, no wheezing Data Reviewed: I have personally reviewed following labs and imaging studies  CBC: Recent Labs  Lab 02/15/19 1547 02/16/19 0504 02/17/19 0504 02/18/19 0445 02/19/19 0531  WBC 7.3 9.2 7.0 5.7 8.2  NEUTROABS 5.9  --   --   --   --   HGB 14.2 12.7 13.9 11.4* 12.0  HCT 44.5 39.5 41.9 34.9* 35.7*  MCV 102.8* 102.6* 101.2* 99.7 99.7  PLT 155 227 233 206 212   Basic Metabolic Panel: Recent Labs  Lab 02/15/19 1801 02/15/19 2234 02/16/19 0504 02/17/19 0504 02/18/19 0445 02/19/19 0531 02/20/19 0430  NA   < >  --  141 137 137 135 133*  K   < >  --  2.8* 3.6 4.3 3.9 3.7  CL   < >  --  98 97* 102 103 103  CO2   < >  --  32 27 27 25 22   GLUCOSE   < >  --  101* 102* 103* 116* 83  BUN   < >  --  17 15 13 12 11   CREATININE   < >  --  0.74 0.52 0.65 0.59 0.59  CALCIUM   < >  --  7.8* 8.0* 8.1* 8.1* 7.7*  MG  --  2.3 2.2 2.2 1.9 1.9  --   PHOS  --  2.5 2.4* 2.4* 3.9 3.2  --    < > = values in this interval not displayed.   GFR: Estimated Creatinine Clearance: 35.4 mL/min (by C-G formula based on SCr of 0.59 mg/dL). Liver Function Tests: Recent Labs  Lab 02/15/19 1801 02/16/19 0504 02/17/19 0504 02/20/19 0430  AST 19 19 13* 13*  ALT 10 13 11 12   ALKPHOS 56 67 67 59  BILITOT 1.5* 0.9 0.9 0.6  PROT 5.5* 6.0* 6.0* 5.1*  ALBUMIN 2.9* 3.1* 3.2* 2.6*   No results for input(s): LIPASE, AMYLASE in the last 168 hours. No results for input(s): AMMONIA in the last 168 hours. Coagulation Profile: No results for input(s): INR, PROTIME in the last 168 hours. Cardiac Enzymes: Recent Labs  Lab 02/15/19 2234  CKTOTAL 40   BNP (last 3 results) No  results for input(s): PROBNP in the last 8760 hours. HbA1C: No results for input(s): HGBA1C in the last 72 hours. CBG: No results for input(s): GLUCAP in the last 168 hours. Lipid Profile: No results for input(s): CHOL, HDL, LDLCALC, TRIG, CHOLHDL, LDLDIRECT in the last 72 hours. Thyroid Function Tests: No results for input(s): TSH, T4TOTAL, FREET4, T3FREE, THYROIDAB in the last 72 hours. Anemia Panel: No results for input(s): VITAMINB12, FOLATE, FERRITIN, TIBC, IRON, RETICCTPCT in the last 72 hours. Sepsis Labs: Recent Labs  Lab 02/15/19 1547  LATICACIDVEN 1.8    Recent Results (from the past 240 hour(s))  Respiratory Panel by RT PCR (Flu A&B, Covid) - Nasopharyngeal Swab     Status: None   Collection Time: 02/15/19  3:47 PM   Specimen: Nasopharyngeal Swab  Result Value Ref Range Status   SARS Coronavirus 2 by RT PCR NEGATIVE NEGATIVE Final    Comment: (NOTE) SARS-CoV-2 target nucleic acids are NOT DETECTED. The SARS-CoV-2 RNA is  generally detectable in upper respiratoy specimens during the acute phase of infection. The lowest concentration of SARS-CoV-2 viral copies this assay can detect is 131 copies/mL. A negative result does not preclude SARS-Cov-2 infection and should not be used as the sole basis for treatment or other patient management decisions. A negative result may occur with  improper specimen collection/handling, submission of specimen other than nasopharyngeal swab, presence of viral mutation(s) within the areas targeted by this assay, and inadequate number of viral copies (<131 copies/mL). A negative result must be combined with clinical observations, patient history, and epidemiological information. The expected result is Negative. Fact Sheet for Patients:  PinkCheek.be Fact Sheet for Healthcare Providers:  GravelBags.it This test is not yet ap proved or cleared by the [place] FDA and  has been  authorized for detection and/or diagnosis of SARS-CoV-2 by FDA under an Emergency Use Authorization (EUA). This EUA will remain  in effect (meaning this test can be used) for the duration of the COVID-19 declaration under Section 564(b)(1) of the Act, 21 U.S.C. section 360bbb-3(b)(1), unless the authorization is terminated or revoked sooner.    Influenza A by PCR NEGATIVE NEGATIVE Final   Influenza B by PCR NEGATIVE NEGATIVE Final    Comment: (NOTE) The Xpert Xpress SARS-CoV-2/FLU/RSV assay is intended as an aid in  the diagnosis of influenza from Nasopharyngeal swab specimens and  should not be used as a sole basis for treatment. Nasal washings and  aspirates are unacceptable for Xpert Xpress SARS-CoV-2/FLU/RSV  testing. Fact Sheet for Patients: PinkCheek.be Fact Sheet for Healthcare Providers: GravelBags.it This test is not yet approved or cleared by the [place] FDA and  has been authorized for detection and/or diagnosis of SARS-CoV-2 by  FDA under an Emergency Use Authorization (EUA). This EUA will remain  in effect (meaning this test can be used) for the duration of the  Covid-19 declaration under Section 564(b)(1) of the Act, 21  U.S.C. section 360bbb-3(b)(1), unless the authorization is  terminated or revoked. Performed at [facility], [place] [address] [place], [place] [address]   Urine culture     Status: Abnormal   Collection Time: 02/16/19  8:15 AM   Specimen: Urine, Random  Result Value Ref Range Status   Specimen Description   Final    URINE, RANDOM Performed at [place] [address], [place], [place] [address]    Special Requests   Final    NONE Performed at [facility], [place] [address], [place], [place] [address]    Culture MULTIPLE SPECIES PRESENT, SUGGEST RECOLLECTION (A)  Final   Report Status 02/17/2019 FINAL  Final  SARS  CORONAVIRUS 2 (TAT 6-24 HRS) Nasopharyngeal Nasopharyngeal Swab     Status: None   Collection Time: 02/19/19  3:13 PM   Specimen: Nasopharyngeal Swab  Result Value Ref Range Status   SARS Coronavirus 2 NEGATIVE NEGATIVE Final    Comment: (NOTE) SARS-CoV-2 target nucleic acids are NOT DETECTED. The SARS-CoV-2 RNA is generally detectable in upper and lower respiratory specimens during the acute phase of infection. Negative results do not preclude SARS-CoV-2 infection, do not rule out co-infections with other pathogens, and should not be used as the sole basis for treatment or other patient management decisions. Negative results must be combined with clinical observations, patient history, and epidemiological information. The expected result is Negative. Fact Sheet for Patients: SugarRoll.be Fact Sheet for Healthcare Providers: https://www.woods-mathews.com/ This test is not yet approved or cleared by the [place]  States FDA and  has been authorized for detection and/or diagnosis of SARS-CoV-2 by FDA under an Emergency Use Authorization (EUA). This EUA will remain  in effect (meaning this test can be used) for the duration of the COVID-19 declaration under Section 56 4(b)(1) of the Act, 21 U.S.C. section 360bbb-3(b)(1), unless the authorization is terminated or revoked sooner. Performed at [facility], [address] [place], [place] [address]      Radiology Studies: No results found.  Scheduled Meds: . amLODipine  10 mg Oral Daily  . atorvastatin  20 mg Oral QHS  . enoxaparin (LOVENOX) injection  30 mg Subcutaneous Q24H  . feeding supplement (ENSURE ENLIVE)  237 mL Oral BID BM  . memantine  10 mg Oral BID  . metoprolol tartrate  12.5 mg Oral BID   Continuous Infusions: . lactated ringers 100 mL/hr at 02/22/19 1008     LOS: 6 days   Rickey Barbara, MD [facility] Pager On Amion  If 7PM-7AM, please contact  night-coverage 02/22/2019, 10:42 AM

## 2019-02-25 DIAGNOSIS — E7849 Other hyperlipidemia: Secondary | ICD-10-CM | POA: Diagnosis not present

## 2019-02-25 DIAGNOSIS — I10 Essential (primary) hypertension: Secondary | ICD-10-CM | POA: Diagnosis not present

## 2019-02-25 DIAGNOSIS — E440 Moderate protein-calorie malnutrition: Secondary | ICD-10-CM | POA: Diagnosis not present

## 2019-02-25 DIAGNOSIS — R5381 Other malaise: Secondary | ICD-10-CM | POA: Diagnosis not present

## 2019-02-25 DIAGNOSIS — E876 Hypokalemia: Secondary | ICD-10-CM | POA: Diagnosis not present

## 2019-02-25 DIAGNOSIS — F0280 Dementia in other diseases classified elsewhere without behavioral disturbance: Secondary | ICD-10-CM | POA: Diagnosis not present

## 2019-02-25 DIAGNOSIS — R627 Adult failure to thrive: Secondary | ICD-10-CM | POA: Diagnosis not present

## 2019-02-27 ENCOUNTER — Non-Acute Institutional Stay: Payer: Medicare Other | Admitting: Licensed Clinical Social Worker

## 2019-02-27 ENCOUNTER — Other Ambulatory Visit: Payer: Self-pay | Admitting: *Deleted

## 2019-02-27 ENCOUNTER — Other Ambulatory Visit: Payer: Self-pay

## 2019-02-27 DIAGNOSIS — I10 Essential (primary) hypertension: Secondary | ICD-10-CM | POA: Diagnosis not present

## 2019-02-27 DIAGNOSIS — Z515 Encounter for palliative care: Secondary | ICD-10-CM

## 2019-02-27 DIAGNOSIS — Z7189 Other specified counseling: Secondary | ICD-10-CM | POA: Diagnosis not present

## 2019-02-27 DIAGNOSIS — T17998A Other foreign object in respiratory tract, part unspecified causing other injury, initial encounter: Secondary | ICD-10-CM | POA: Diagnosis not present

## 2019-02-27 NOTE — Patient Outreach (Signed)
Screened for potential [facility] Care Management needs as a benefit of  [employer].  Member is currently receiving skilled therapy at [facility].   Writer attended telephonic interdisciplinary team meeting to assess for disposition needs and transition plan for resident.   Facility reports member unable to tolerate much therapy. Facility endorses family is looking into long term care placement. Member needs 24/7. States palliative care referral has been made.   Raiford Noble, MSN-Ed, RN,BSN [facility] Post Acute Care Coordinator [phone] [facility]) [phone]  (Toll free office)

## 2019-02-28 NOTE — Progress Notes (Signed)
COMMUNITY PALLIATIVE CARE SW NOTE  PATIENT NAME: Patricia Carlson DOB: September 06, 1925 MRN: [id number]  PRIMARY CARE PROVIDER: Lupita Raider, MD  RESPONSIBLE PARTY:  Acct ID - Guarantor Home Phone Work Phone Relationship Acct Type  [id number] - Kauzlarich,JA* [phone]  Self P/F     [address], [place], [place] [address]   Due to the COVID-19 crisis, this virtual check-in visit was done via telephone from my office and it was initiated and consent given by thispatient.  PLAN OF CARE and INTERVENTIONS:             1. GOALS OF CARE/ ADVANCE CARE PLANNING:  Daughter's goal is to obtain additional information regarding patient's condition at the facility.  She was a DNR in the hospital previously. 2. SOCIAL/EMOTIONAL/SPIRITUAL ASSESSMENT/ INTERVENTIONS:  SW conducted a Biomedical engineer visit with patient's daughter, [name].  She expressed frustration with communication at [facility].  She reported physical therapy stated that patient could not sit up in bed by herself or walk.  Prior to the hospitalization, patient was living independently in her home.  Around Christmas, patient displayed a significant decline.  "She has had a good life.  I just need to plan."  SW provided active listening and supportive counseling.  SW attempted to contact patient's nurse at the facility for information.   3. PATIENT/CAREGIVER EDUCATION/ COPING:  Provided education regarding the Palliative Care program.  [name] copes by problem-solving. 4. PERSONAL EMERGENCY PLAN:  Per facility protocol. 5. COMMUNITY RESOURCES COORDINATION/ HEALTH CARE NAVIGATION:  None. 6. FINANCIAL/LEGAL CONCERNS/INTERVENTIONS:  None.     SOCIAL HX:  Social History   Tobacco Use  . Smoking status: Never Smoker  . Smokeless tobacco: Never Used  Substance Use Topics  . Alcohol use: Never    CODE STATUS:  DNR  ADVANCED DIRECTIVES: N MOST FORM COMPLETE:  N HOSPICE EDUCATION PROVIDED:  Y PPS:  Patient appetite has declined.  She  is bed bound. Duration of visit and documentation:  45 minutes.      Vella Kohler Jaidin Richison, LCSW

## 2019-03-10 DEATH — deceased

## 2021-04-26 IMAGING — CT CT HEAD W/O CM
3 series · 14 of 47 positions shown, 16 images · non-contrast
Comparison: None.

CLINICAL DATA: Altered mental status

EXAM:
CT HEAD WITHOUT CONTRAST
TECHNIQUE: Contiguous axial images were obtained from the base of the skull
through the vertex without intravenous contrast.

[Series 2: head wo · axial · 0.47mm/px · z∈[-163,-38]mm · 8 of 31 slices shown, 10 images]
[im 3/31  brain]
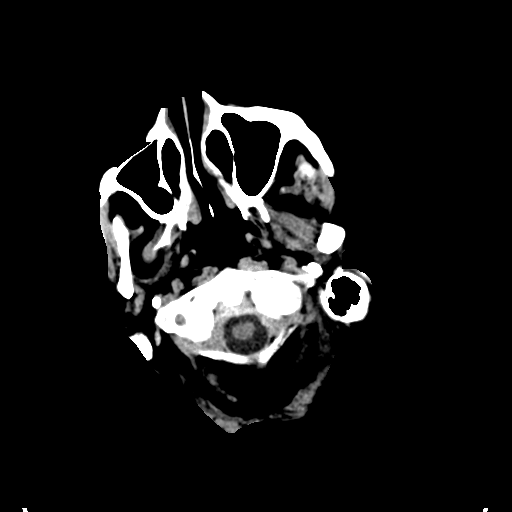
[im 3/31  bone]
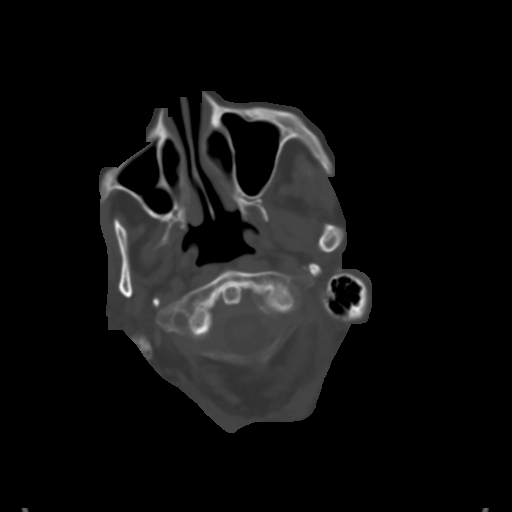
[im 7/31  brain]
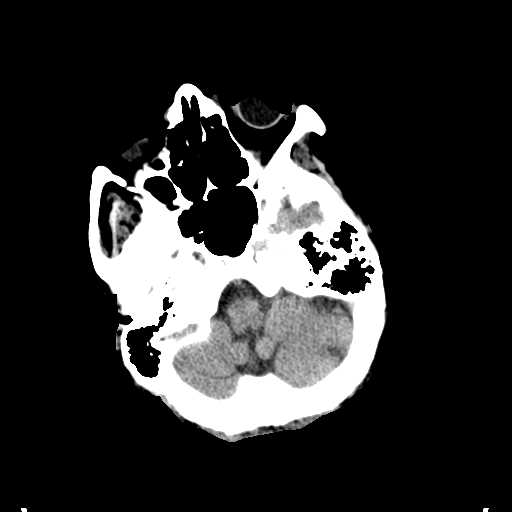
[im 10/31  brain]
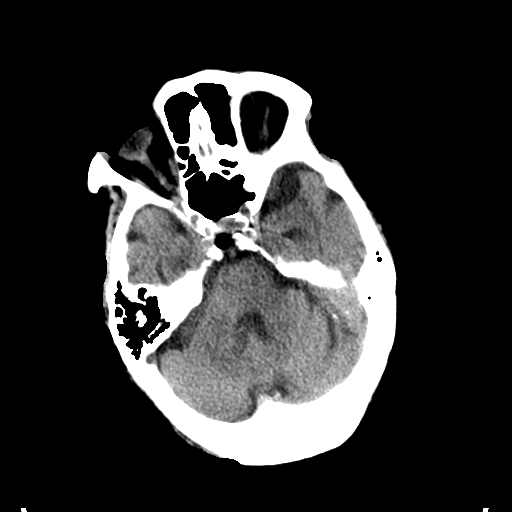
[im 14/31  brain]
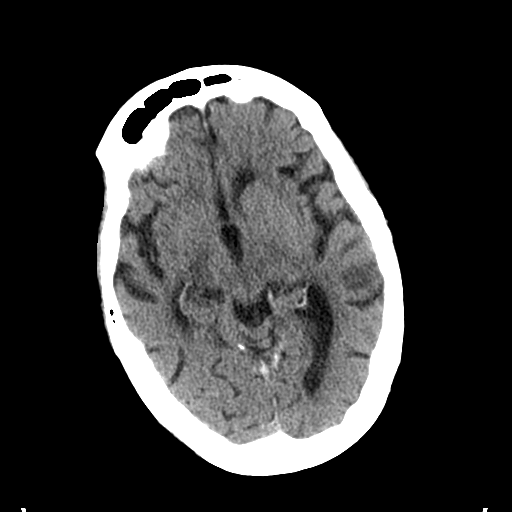
[im 17/31  brain]
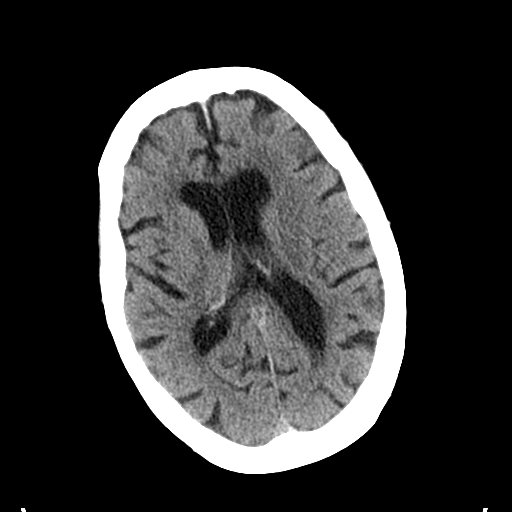
[im 17/31  bone]
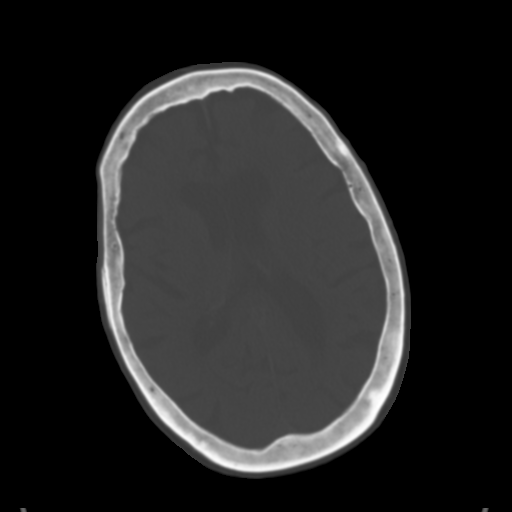
[im 21/31  brain]
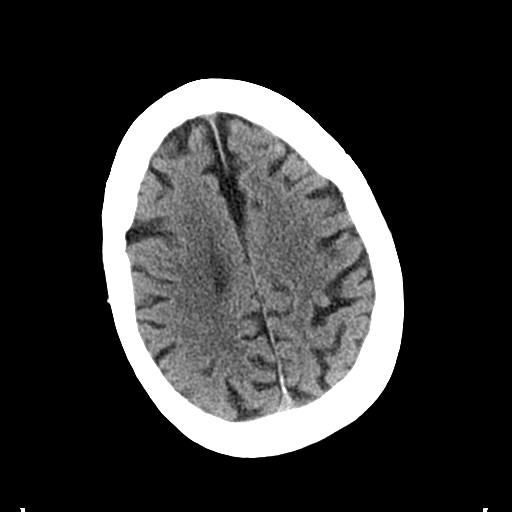
[im 24/31  brain]
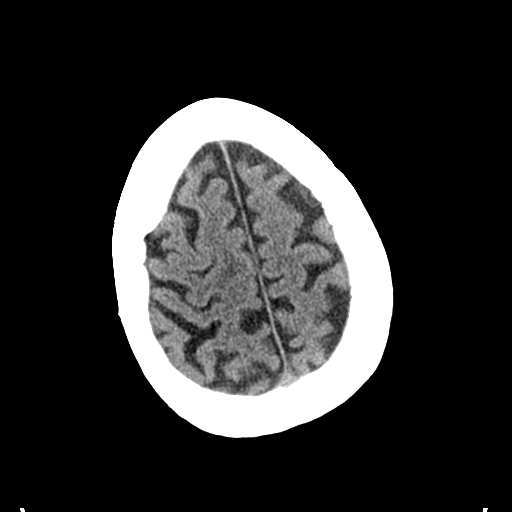
[im 28/31  brain]
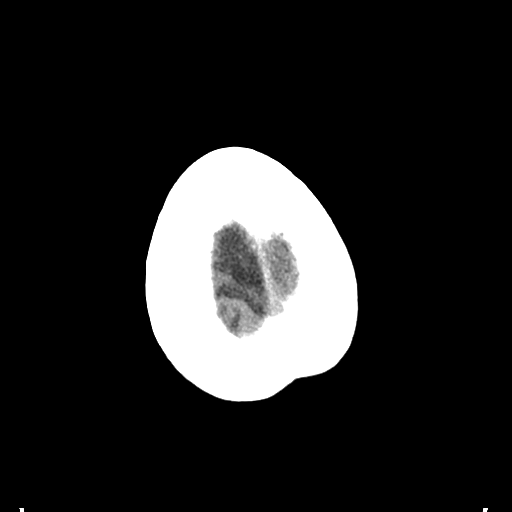

[Series 4: coronal soft tissue · coronal · 0.32mm/px · 3 of 64 slices shown]
[im 22/64  brain]
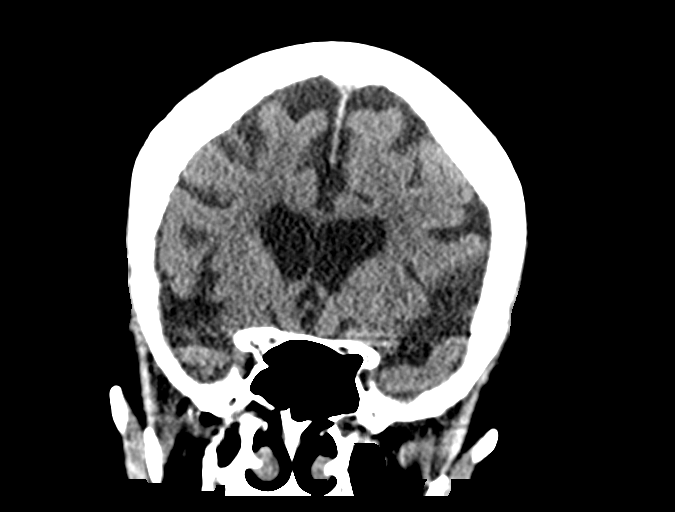
[im 29/64  brain]
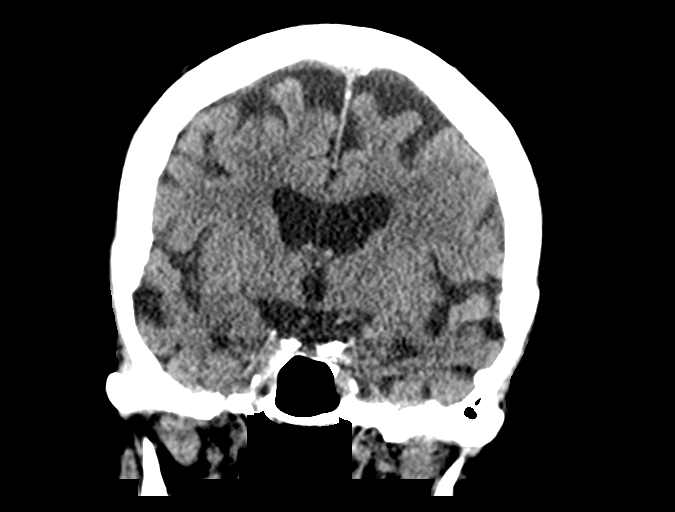
[im 36/64  brain]
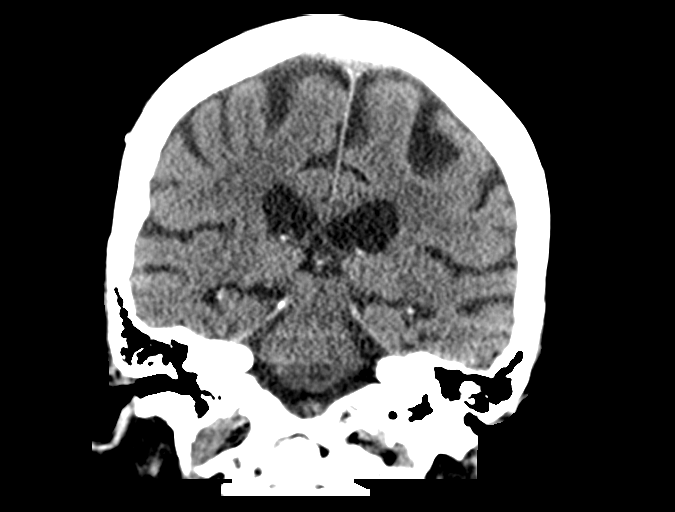

[Series 5: sagittal soft tissue · sagittal · 0.34mm/px · 3 of 49 slices shown]
[im 17/49  brain]
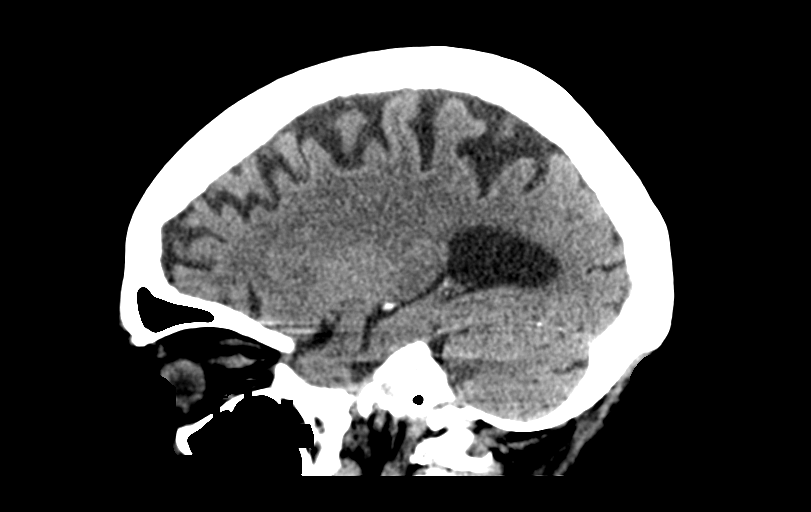
[im 25/49  brain]
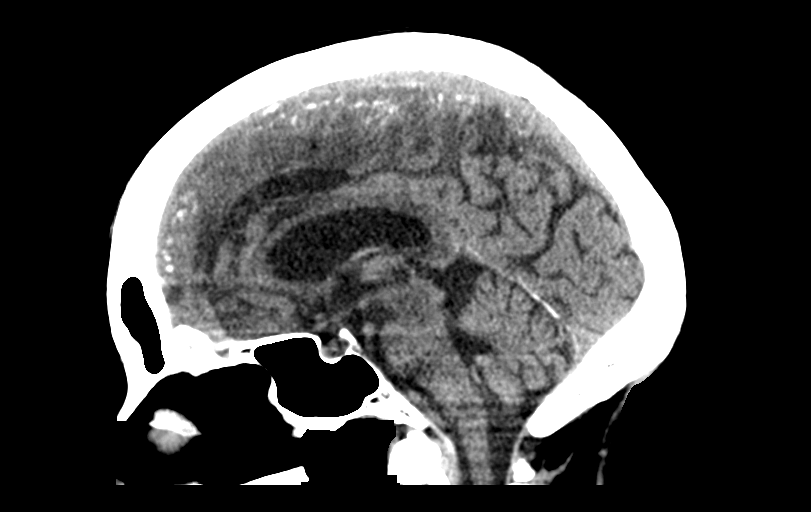
[im 33/49  brain]
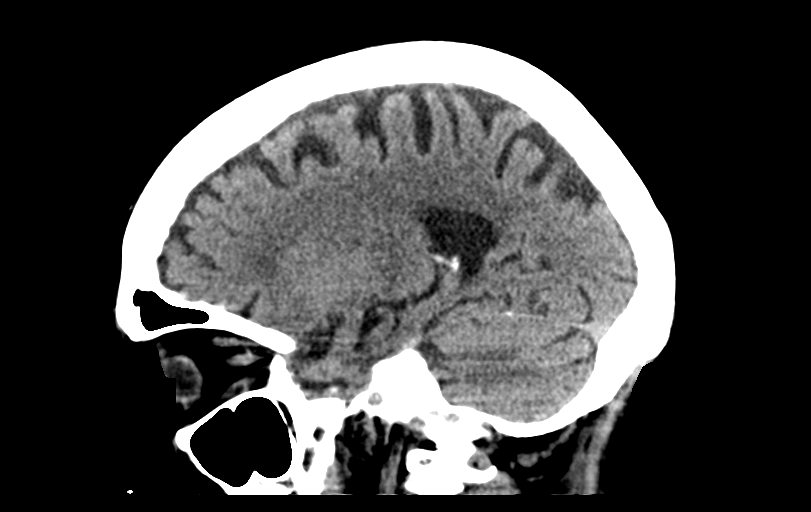

[14 of 47 positions shown; findings below may reference images not displayed]

FINDINGS: Brain: No evidence of acute infarction, hemorrhage, hydrocephalus,
extra-axial collection or mass lesion/mass effect. There is mild
cerebral volume loss with associated ex vacuo dilatation.
Periventricular white matter hypoattenuation likely represents
chronic small vessel ischemic disease.

Vascular: There are vascular calcifications in the carotid siphons.

Skull: Normal. Negative for fracture or focal lesion.

Sinuses/Orbits: No acute finding.

Other: None.
IMPRESSION: 1. No acute intracranial process.
